# Patient Record
Sex: Female | Born: 1935 | Race: White | Hispanic: No | Smoking: Former smoker
Health system: Southern US, Community
[De-identification: ages and names within clinical notes are randomized; demographics above are authoritative.]

## PROBLEM LIST (undated history)

## (undated) DIAGNOSIS — M797 Fibromyalgia: Secondary | ICD-10-CM

## (undated) DIAGNOSIS — F32A Depression, unspecified: Secondary | ICD-10-CM

## (undated) DIAGNOSIS — E785 Hyperlipidemia, unspecified: Secondary | ICD-10-CM

## (undated) DIAGNOSIS — G459 Transient cerebral ischemic attack, unspecified: Secondary | ICD-10-CM

## (undated) DIAGNOSIS — G8929 Other chronic pain: Secondary | ICD-10-CM

## (undated) DIAGNOSIS — G43909 Migraine, unspecified, not intractable, without status migrainosus: Secondary | ICD-10-CM

## (undated) DIAGNOSIS — Z8719 Personal history of other diseases of the digestive system: Secondary | ICD-10-CM

## (undated) DIAGNOSIS — F419 Anxiety disorder, unspecified: Secondary | ICD-10-CM

## (undated) DIAGNOSIS — J189 Pneumonia, unspecified organism: Secondary | ICD-10-CM

## (undated) DIAGNOSIS — M1990 Unspecified osteoarthritis, unspecified site: Secondary | ICD-10-CM

## (undated) DIAGNOSIS — M549 Dorsalgia, unspecified: Secondary | ICD-10-CM

## (undated) DIAGNOSIS — I10 Essential (primary) hypertension: Secondary | ICD-10-CM

## (undated) DIAGNOSIS — F329 Major depressive disorder, single episode, unspecified: Secondary | ICD-10-CM

## (undated) DIAGNOSIS — K219 Gastro-esophageal reflux disease without esophagitis: Secondary | ICD-10-CM

## (undated) HISTORY — PX: BACK SURGERY: SHX140

## (undated) HISTORY — PX: CARPAL TUNNEL RELEASE: SHX101

## (undated) HISTORY — PX: APPENDECTOMY: SHX54

---

## 2005-05-31 HISTORY — PX: LUMBAR DISC SURGERY: SHX700

## 2013-08-29 HISTORY — PX: CAROTID ENDARTERECTOMY: SUR193

## 2014-07-13 ENCOUNTER — Inpatient Hospital Stay (HOSPITAL_COMMUNITY): Payer: Medicare (Managed Care)

## 2014-07-13 ENCOUNTER — Inpatient Hospital Stay (HOSPITAL_COMMUNITY)
Admission: AD | Admit: 2014-07-13 | Discharge: 2014-07-16 | DRG: 482 | Disposition: A | Discharge status: Home Health | Payer: Medicare (Managed Care) | Source: Other Acute Inpatient Hospital | Attending: Internal Medicine | Admitting: Internal Medicine

## 2014-07-13 ENCOUNTER — Encounter (HOSPITAL_COMMUNITY): Payer: Self-pay | Admitting: Internal Medicine

## 2014-07-13 DIAGNOSIS — F418 Other specified anxiety disorders: Secondary | ICD-10-CM | POA: Diagnosis present

## 2014-07-13 DIAGNOSIS — M810 Age-related osteoporosis without current pathological fracture: Secondary | ICD-10-CM | POA: Diagnosis present

## 2014-07-13 DIAGNOSIS — R059 Cough, unspecified: Secondary | ICD-10-CM

## 2014-07-13 DIAGNOSIS — E785 Hyperlipidemia, unspecified: Secondary | ICD-10-CM | POA: Diagnosis present

## 2014-07-13 DIAGNOSIS — F419 Anxiety disorder, unspecified: Secondary | ICD-10-CM | POA: Diagnosis present

## 2014-07-13 DIAGNOSIS — S72002A Fracture of unspecified part of neck of left femur, initial encounter for closed fracture: Principal | ICD-10-CM | POA: Diagnosis present

## 2014-07-13 DIAGNOSIS — M797 Fibromyalgia: Secondary | ICD-10-CM | POA: Diagnosis present

## 2014-07-13 DIAGNOSIS — W19XXXA Unspecified fall, initial encounter: Secondary | ICD-10-CM | POA: Diagnosis present

## 2014-07-13 DIAGNOSIS — R4589 Other symptoms and signs involving emotional state: Secondary | ICD-10-CM | POA: Diagnosis present

## 2014-07-13 DIAGNOSIS — R05 Cough: Secondary | ICD-10-CM

## 2014-07-13 DIAGNOSIS — S7292XA Unspecified fracture of left femur, initial encounter for closed fracture: Secondary | ICD-10-CM | POA: Diagnosis present

## 2014-07-13 DIAGNOSIS — T148XXA Other injury of unspecified body region, initial encounter: Secondary | ICD-10-CM

## 2014-07-13 DIAGNOSIS — Z419 Encounter for procedure for purposes other than remedying health state, unspecified: Secondary | ICD-10-CM

## 2014-07-13 DIAGNOSIS — S7291XA Unspecified fracture of right femur, initial encounter for closed fracture: Secondary | ICD-10-CM

## 2014-07-13 DIAGNOSIS — I10 Essential (primary) hypertension: Secondary | ICD-10-CM | POA: Diagnosis present

## 2014-07-13 HISTORY — DX: Anxiety disorder, unspecified: F41.9

## 2014-07-13 HISTORY — DX: Pneumonia, unspecified organism: J18.9

## 2014-07-13 HISTORY — DX: Essential (primary) hypertension: I10

## 2014-07-13 HISTORY — DX: Unspecified osteoarthritis, unspecified site: M19.90

## 2014-07-13 HISTORY — DX: Hyperlipidemia, unspecified: E78.5

## 2014-07-13 HISTORY — DX: Fibromyalgia: M79.7

## 2014-07-13 HISTORY — DX: Gastro-esophageal reflux disease without esophagitis: K21.9

## 2014-07-13 HISTORY — DX: Dorsalgia, unspecified: M54.9

## 2014-07-13 HISTORY — DX: Depression, unspecified: F32.A

## 2014-07-13 HISTORY — DX: Migraine, unspecified, not intractable, without status migrainosus: G43.909

## 2014-07-13 HISTORY — DX: Other chronic pain: G89.29

## 2014-07-13 HISTORY — DX: Major depressive disorder, single episode, unspecified: F32.9

## 2014-07-13 HISTORY — DX: Transient cerebral ischemic attack, unspecified: G45.9

## 2014-07-13 HISTORY — DX: Personal history of other diseases of the digestive system: Z87.19

## 2014-07-13 LAB — MRSA PCR SCREENING: MRSA by PCR: NEGATIVE

## 2014-07-13 LAB — BASIC METABOLIC PANEL
Anion gap: 8 (ref 5–15)
BUN: 15 mg/dL (ref 6–23)
CHLORIDE: 105 mmol/L (ref 96–112)
CO2: 26 mmol/L (ref 19–32)
Calcium: 9.1 mg/dL (ref 8.4–10.5)
Creatinine, Ser: 1.07 mg/dL (ref 0.50–1.10)
GFR calc Af Amer: 56 mL/min — ABNORMAL LOW (ref >90)
GFR calc non Af Amer: 48 mL/min — ABNORMAL LOW (ref >90)
Glucose, Bld: 93 mg/dL (ref 70–99)
Potassium: 4.1 mmol/L (ref 3.5–5.1)
SODIUM: 139 mmol/L (ref 135–145)

## 2014-07-13 LAB — CBC
HEMATOCRIT: 42 % (ref 36.0–46.0)
HEMOGLOBIN: 13.7 g/dL (ref 12.0–15.0)
MCH: 30.4 pg (ref 26.0–34.0)
MCHC: 32.6 g/dL (ref 30.0–36.0)
MCV: 93.3 fL (ref 78.0–100.0)
PLATELETS: 202 10*3/uL (ref 150–400)
RBC: 4.5 MIL/uL (ref 3.87–5.11)
RDW: 12.9 % (ref 11.5–15.5)
WBC: 8.3 10*3/uL (ref 4.0–10.5)

## 2014-07-13 LAB — PROTIME-INR
INR: 0.97 (ref 0.00–1.49)
Prothrombin Time: 13 seconds (ref 11.6–15.2)

## 2014-07-13 MED ORDER — ONDANSETRON HCL 4 MG PO TABS: 4.0000 mg | ORAL_TABLET | Freq: Four times a day (QID) | ORAL | Status: DC | PRN

## 2014-07-13 MED ORDER — LORAZEPAM 1 MG PO TABS
1.0000 mg | ORAL_TABLET | Freq: Three times a day (TID) | ORAL | Status: DC | PRN
  Administered 2014-07-13 – 2014-07-16 (×3): 1 mg via ORAL
  Filled 2014-07-13 (×4): qty 1

## 2014-07-13 MED ORDER — MORPHINE SULFATE 2 MG/ML IJ SOLN: 0.5000 mg | INTRAMUSCULAR | Status: DC | PRN

## 2014-07-13 MED ORDER — LOSARTAN POTASSIUM 50 MG PO TABS
50.0000 mg | ORAL_TABLET | Freq: Every day | ORAL | Status: DC
  Administered 2014-07-15 – 2014-07-16 (×2): 50 mg via ORAL
  Filled 2014-07-13 (×4): qty 1

## 2014-07-13 MED ORDER — VITAMIN D3 25 MCG (1000 UNIT) PO TABS
1000.0000 [IU] | ORAL_TABLET | Freq: Two times a day (BID) | ORAL | Status: DC
  Administered 2014-07-13 – 2014-07-16 (×5): 1000 [IU] via ORAL
  Filled 2014-07-13 (×8): qty 1

## 2014-07-13 MED ORDER — ONDANSETRON HCL 4 MG/2ML IJ SOLN: 4.0000 mg | Freq: Four times a day (QID) | INTRAMUSCULAR | Status: DC | PRN

## 2014-07-13 MED ORDER — DULOXETINE HCL 30 MG PO CPEP
30.0000 mg | ORAL_CAPSULE | Freq: Every day | ORAL | Status: DC
  Administered 2014-07-13 – 2014-07-16 (×3): 30 mg via ORAL
  Filled 2014-07-13 (×4): qty 1

## 2014-07-13 MED ORDER — CEFAZOLIN SODIUM-DEXTROSE 2-3 GM-% IV SOLR
2.0000 g | INTRAVENOUS | Status: AC
  Administered 2014-07-14: 2 g via INTRAVENOUS
  Filled 2014-07-13: qty 50

## 2014-07-13 MED ORDER — SODIUM CHLORIDE 0.9 % IV SOLN
INTRAVENOUS | Status: AC
  Administered 2014-07-13: 19:00:00 via INTRAVENOUS

## 2014-07-13 MED ORDER — DEXTROSE-NACL 5-0.45 % IV SOLN: 100.0000 mL/h | INTRAVENOUS | Status: DC

## 2014-07-13 MED ORDER — ATORVASTATIN CALCIUM 40 MG PO TABS
40.0000 mg | ORAL_TABLET | Freq: Every day | ORAL | Status: DC
  Administered 2014-07-13 – 2014-07-16 (×4): 40 mg via ORAL
  Filled 2014-07-13 (×5): qty 1

## 2014-07-13 MED ORDER — ESCITALOPRAM OXALATE 20 MG PO TABS
20.0000 mg | ORAL_TABLET | Freq: Every day | ORAL | Status: DC
  Administered 2014-07-15: 20 mg via ORAL
  Filled 2014-07-13 (×3): qty 1
  Filled 2014-07-13: qty 2
  Filled 2014-07-13: qty 1

## 2014-07-13 MED ORDER — HYDROCODONE-ACETAMINOPHEN 5-325 MG PO TABS
1.0000 | ORAL_TABLET | Freq: Four times a day (QID) | ORAL | Status: DC | PRN
  Administered 2014-07-13 – 2014-07-14 (×2): 2 via ORAL
  Administered 2014-07-15: 1 via ORAL
  Administered 2014-07-15 – 2014-07-16 (×2): 2 via ORAL
  Filled 2014-07-13 (×5): qty 2
  Filled 2014-07-13: qty 1

## 2014-07-13 MED ORDER — ACETAMINOPHEN 500 MG PO TABS: 1000.0000 mg | ORAL_TABLET | Freq: Once | ORAL | Status: DC

## 2014-07-13 MED ORDER — POLYETHYLENE GLYCOL 3350 17 G PO PACK
17.0000 g | PACK | Freq: Two times a day (BID) | ORAL | Status: DC
  Administered 2014-07-15 – 2014-07-16 (×2): 17 g via ORAL
  Filled 2014-07-13 (×5): qty 1

## 2014-07-13 MED ORDER — GUAIFENESIN-DM 100-10 MG/5ML PO SYRP
5.0000 mL | ORAL_SOLUTION | ORAL | Status: DC | PRN
  Filled 2014-07-13: qty 5

## 2014-07-13 MED ORDER — METOPROLOL TARTRATE 1 MG/ML IV SOLN
5.0000 mg | INTRAVENOUS | Status: DC | PRN
  Filled 2014-07-13: qty 5

## 2014-07-13 MED ORDER — ALUM & MAG HYDROXIDE-SIMETH 200-200-20 MG/5ML PO SUSP: 30.0000 mL | Freq: Four times a day (QID) | ORAL | Status: DC | PRN

## 2014-07-13 MED ORDER — GABAPENTIN 300 MG PO CAPS
300.0000 mg | ORAL_CAPSULE | Freq: Three times a day (TID) | ORAL | Status: DC | PRN
  Administered 2014-07-14: 300 mg via ORAL
  Filled 2014-07-13: qty 1

## 2014-07-13 NOTE — H&P (Signed)
Patient Demographics  Kimberly Higgins, is a 81 y.o. female  MRN: [id number]   DOB - 1933-06-27  Admit Date - 07/13/2014  Outpatient Primary MD for the patient is No primary care provider on file.   With History of -  Past Medical History  Diagnosis Date  . Essential hypertension   . Dyslipidemia   . Anxiety   . Depressed affect   . Fibromyalgia       Past Surgical History  Procedure Laterality Date  . Spine surgery    . Carotid endarterectomy    . Carpel tun    . Appendectomy      in for   Transfer from [facility] for left femoral neck fracture  HPI  Kimberly Higgins  is a 82 y.o. female, with history of essential hypertension, dyslipidemia, left carotid artery disease status post left carotid endarterectomy in April of last year cleared with chemical stress test in [place] prior to surgery, fibromyalgia, anxiety and depression, who sustained a mechanical fall after which she experienced left-sided hip pain. Presented to [facility] where she was diagnosed with femoral neck fracture on the left side. She was then transferred here under the care of hospitalist after Dr. Eulah Pont agree to operate on her.  Patient currently is symptom free except for mild left hip discomfort which is dull constant nonradiating pain worse with bearing weight better with rest and pain medication nauseated symptoms. Pain has been ongoing since the fall this afternoon. She denies any headache, no chest pain cough phlegm for palpitations, no shortness of breath at rest, no abdominal pain, no dysuria, no blood in stool or urine. No focal weakness.    Review of Systems    In addition to the HPI above,   No Fever-chills, No Headache, No changes with Vision or hearing, No problems swallowing food or Liquids, No Chest  pain, Cough or Shortness of Breath, No Abdominal pain, No Nausea or Vommitting, Bowel movements are regular, No Blood in stool or Urine, No dysuria, No new skin rashes or bruises, No new joints pains-aches,  except left hip pain as above  No new weakness, tingling, numbness in any extremity, No recent weight gain or loss, No polyuria, polydypsia or polyphagia, No significant Mental Stressors.  A full 10 point Review of Systems was done, except as stated above, all other Review of Systems were negative.   Social History History  Substance Use Topics  . Smoking status: Not on file  . Smokeless tobacco: Not on file  . Alcohol Use: Not on file     Family History No family history of CAD at a young age  Prior to Admission medications   Medication Sig Start Date End Date Taking? Authorizing Provider  atorvastatin (LIPITOR) 40 MG tablet Take 40 mg by mouth daily.   Yes Historical Provider, MD  cholecalciferol (VITAMIN D) 1000 UNITS tablet Take 1,000 Units by mouth 2 (two) times daily.  Yes Historical Provider, MD  DULoxetine (CYMBALTA) 30 MG capsule Take 30 mg by mouth daily.   Yes Historical Provider, MD  escitalopram (LEXAPRO) 20 MG tablet Take 20 mg by mouth daily.   Yes Historical Provider, MD  gabapentin (NEURONTIN) 300 MG capsule Take 300 mg by mouth 3 (three) times daily.   Yes Historical Provider, MD  LORazepam (ATIVAN) 1 MG tablet Take 1 mg by mouth every 8 (eight) hours as needed for anxiety.   Yes Historical Provider, MD  losartan (COZAAR) 50 MG tablet Take 50 mg by mouth daily.   Yes Historical Provider, MD    Allergies  Allergen Reactions  . Niacin And Related Itching    Red face  . Theophyllines Palpitations    Physical Exam  Vitals  Blood pressure 147/88, pulse 87, temperature 97.9 F (36.6 C), temperature source Oral, resp. rate 18, SpO2 98 %.   1. General Pleasant elderly white femaleing in bed in NAD,     2. Normal affect and insight, Not Suicidal or  Homicidal, Awake Alert, Oriented X 3.  3. No F.N deficits, ALL C.Nerves Intact, Strength 5/5 all 4 extremities, Sensation intact all 4 extremities, Plantars down going.  4. Ears and Eyes appear Normal, Conjunctivae clear, PERRLA. Moist Oral Mucosa.  5. Supple Neck, No JVD, No cervical lymphadenopathy appriciated, No Carotid Bruits.  6. Symmetrical Chest wall movement, Good air movement bilaterally, CTAB.  7. RRR, No Gallops, Rubs or Murmurs, No Parasternal Heave.  8. Positive Bowel Sounds, Abdomen Soft, No tenderness, No organomegaly appriciated,No rebound -guarding or rigidity.  9.  No Cyanosis, Normal Skin Turgor, No Skin Rash or Bruise.  10. Good muscle tone,  joints appear normal , no effusions, Normal ROM.  11. No Palpable Lymph Nodes in Neck or Axillae     Data Review  CBC No results for input(s): WBC, HGB, HCT, PLT, MCV, MCH, MCHC, RDW, LYMPHSABS, MONOABS, EOSABS, BASOSABS, BANDABS in the last 168 hours.  Invalid input(s): NEUTRABS, BANDSABD ------------------------------------------------------------------------------------------------------------------  Chemistries  No results for input(s): NA, K, CL, CO2, GLUCOSE, BUN, CREATININE, CALCIUM, MG, AST, ALT, ALKPHOS, BILITOT in the last 168 hours.  Invalid input(s): GFRCGP ------------------------------------------------------------------------------------------------------------------ CrCl cannot be calculated (Unknown ideal weight.). ------------------------------------------------------------------------------------------------------------------ No results for input(s): TSH, T4TOTAL, T3FREE, THYROIDAB in the last 72 hours.  Invalid input(s): FREET3   Coagulation profile No results for input(s): INR, PROTIME in the last 168 hours. ------------------------------------------------------------------------------------------------------------------- No results for input(s): DDIMER in the last 72  hours. -------------------------------------------------------------------------------------------------------------------  Cardiac Enzymes No results for input(s): CKMB, TROPONINI, MYOGLOBIN in the last 168 hours.  Invalid input(s): CK ------------------------------------------------------------------------------------------------------------------ Invalid input(s): POCBNP   ---------------------------------------------------------------------------------------------------------------  Urinalysis No results found for: COLORURINE, APPEARANCEUR, LABSPEC, PHURINE, GLUCOSEU, HGBUR, BILIRUBINUR, KETONESUR, PROTEINUR, UROBILINOGEN, NITRITE, LEUKOCYTESUR  ----------------------------------------------------------------------------------------------------------------  Imaging results:   No results found.   No labs, chest x-ray, EKG were obtained at [facility]. I have ordered them. We will review in the morning prior to surgery.     EKG obtained stat. Normal sinus rhythm with a rate of 64 bpm, nonspecific ST changes nonacute.   Assessment & Plan   1. Mechanical fall with closed left femoral neck fracture. Will be admitted to MedSurg bed, nothing by mouth after midnight, discussed with Dr. Marcial Pacas he will operate on the patient tomorrow morning. No labs were drawn at [facility]. We will draw baseline labs along with chest x-ray.    Cardio-Pulm Risk stratification for surgery and recommendations to minimize the same:-  A.Cardio-Pulmonary Risk -  this  patient is a moderate risk  for adverse Cardio-Pulmonary  Outcome  from surgery, the risks and benefits were discussed and acceptable to the patient.  Recommendations for optimizing Cardio-Pulmonary  Risk risk factors  1. Keep SBP<140, HR<85, use Lopressor 5mg  IV q4hrs PRN, or B.Blocker drip PRN. 2. Moniotr I&Os. 3. Minimal sedation and Narcotics. 4. Good pulmunary toilet. 5. PRN Nebs and as needed oxygen to  keep Pox>90% 6. Hb>8, transfuse as needed- Lasix 10mg  IV after each unit PRBC Transfused.   B.Bleeding Risk - no previous surgical complications, no easy bruising,  Antiplate meds none. Will obtain baseline CBC with platelet count along with baseline EKG, BMP and INR.   Will request Surgeon to please Order DVT prophylaxis of his/her choice, along with activity, weight bearing precautions and diet if appropriate.      2. Essential hypertension. Home Cozaar will be continued along with as needed IV Lopressor.   3. Depression and anxiety. Continue Cymbalta and Lexapro on with as needed Ativan.   4. Fibromyalgia. On Neurontin and stable. Continue.   5.Dyslipidemia. Continue statin unchanged.     DVT Prophylaxis  SCDs for now prior to surgery tomorrow morning.   AM Labs Ordered, also please review Full Orders  Family Communication: Admission, patients condition and plan of care including tests being ordered have been discussed with the patient  who indicate understanding and agree with the plan and Code Status.  Code Status Full  Likely DC to  TBD  Condition Fair  Time spent in minutes : 35    Traveion Ruddock K M.D on 07/13/2014 at 6:45 PM  Between 7am to 7pm - Pager - [phone]  After 7pm go to [email or web address] - password [email or web address]  [facility] Office  [phone]

## 2014-07-13 NOTE — Consult Note (Signed)
[facility] have [facility] and discussed with the patient over the phone. Tentative plan is for OR tomorrow morning pending clearence and I will order an xray for tonight too   Margarita RanaMURPHY, Stanislawa Gaffin, D Cell: [phone]

## 2014-07-13 NOTE — Consult Note (Signed)
ORTHOPAEDIC CONSULTATION  REQUESTING PHYSICIAN: Cristal Ford, DO  Chief Complaint: left fem neck fracture  HPI: Kimberly Higgins is a 82 y.o. female who has a history including a left carotid endarterectomy in April last yr. She suffered a mechanical fall and was transferred from [facility] last night. C/o pain in the left groin. She reports that ever since a tick bite four years ago she has had several slow healing sores.   Past Medical History  Diagnosis Date  . Essential hypertension   . Dyslipidemia   . Anxiety   . Depressed affect   . Fibromyalgia    Past Surgical History  Procedure Laterality Date  . Spine surgery    . Carotid endarterectomy    . Carpel tun    . Appendectomy     History   Social History  . Marital Status: Divorced    Spouse Name: N/A  . Number of Children: N/A  . Years of Education: N/A   Social History Main Topics  . Smoking status: Not on file  . Smokeless tobacco: Not on file  . Alcohol Use: Not on file  . Drug Use: Not on file  . Sexual Activity: Not on file   Other Topics Concern  . Not on file   Social History Narrative  . No narrative on file   No family history on file. Allergies  Allergen Reactions  . Theophyllines    Prior to Admission medications   Medication Sig Start Date End Date Taking? Authorizing Provider  atorvastatin (LIPITOR) 40 MG tablet Take 40 mg by mouth daily.   Yes Historical Provider, MD  cholecalciferol (VITAMIN D) 1000 UNITS tablet Take 1,000 Units by mouth 2 (two) times daily.   Yes Historical Provider, MD  DULoxetine (CYMBALTA) 30 MG capsule Take 30 mg by mouth daily.   Yes Historical Provider, MD  escitalopram (LEXAPRO) 20 MG tablet Take 20 mg by mouth daily.   Yes Historical Provider, MD  gabapentin (NEURONTIN) 300 MG capsule Take 300 mg by mouth 3 (three) times daily.   Yes Historical Provider, MD  LORazepam (ATIVAN) 1 MG tablet Take 1 mg by mouth every 8 (eight) hours as needed for anxiety.   Yes  Historical Provider, MD  losartan (COZAAR) 50 MG tablet Take 50 mg by mouth daily.   Yes Historical Provider, MD   No results found.  Positive ROS: All other systems have been reviewed and were otherwise negative with the exception of those mentioned in the HPI and as above.  Labs cbc No results for input(s): WBC, HGB, HCT, PLT in the last 72 hours.  Labs inflam No results for input(s): CRP in the last 72 hours.  Invalid input(s): ESR  Labs coag No results for input(s): INR, PTT in the last 72 hours.  Invalid input(s): PT  No results for input(s): NA, K, CL, CO2, GLUCOSE, BUN, CREATININE, CALCIUM in the last 72 hours.  Physical Exam: There were no vitals filed for this visit. General: Alert, no acute distress Cardiovascular: No pedal edema Respiratory: No cyanosis, no use of accessory musculature GI: No organomegaly, abdomen is soft and non-tender Skin: No lesions in the area of chief complaint other than those listed below in MSK exam.  Neurologic: Sensation intact distally Psychiatric: Patient is competent for consent with normal mood and affect Lymphatic: No axillary or cervical lymphadenopathy  MUSCULOSKELETAL:  LLE: pain with log roll, Distally NVI. She has a small 0.5cm sore over her GT. No drainage, no surrounding  erythema Other extremities are atraumatic with painless ROM and NVI.  Assessment: Left fem neck fracture  Plan: Hip pinning today I discussed with her and her daughter the risks and benefits. We also discussed that her sore puts her at increased risk for infection. I can avoid this with my incision, and I still feel that the benefits outweigh the risks of delaying her surgery as this would take a long time to heal  Bedrest for now, WBAT post op   Edmonia Lynch, D, MD Cell [phone]   07/13/2014 6:28 PM

## 2014-07-14 ENCOUNTER — Inpatient Hospital Stay (HOSPITAL_COMMUNITY): Payer: Medicare (Managed Care)

## 2014-07-14 ENCOUNTER — Inpatient Hospital Stay (HOSPITAL_COMMUNITY): Payer: Medicare (Managed Care) | Admitting: Anesthesiology

## 2014-07-14 ENCOUNTER — Encounter (HOSPITAL_COMMUNITY)
Admission: AD | Disposition: A | Discharge status: Home Health | Payer: Self-pay | Source: Other Acute Inpatient Hospital | Attending: Internal Medicine

## 2014-07-14 DIAGNOSIS — S7292XA Unspecified fracture of left femur, initial encounter for closed fracture: Secondary | ICD-10-CM

## 2014-07-14 DIAGNOSIS — F329 Major depressive disorder, single episode, unspecified: Secondary | ICD-10-CM

## 2014-07-14 DIAGNOSIS — M797 Fibromyalgia: Secondary | ICD-10-CM

## 2014-07-14 DIAGNOSIS — S72002A Fracture of unspecified part of neck of left femur, initial encounter for closed fracture: Principal | ICD-10-CM

## 2014-07-14 HISTORY — PX: HIP PINNING,CANNULATED: SHX1758

## 2014-07-14 LAB — CBC
HEMATOCRIT: 40.5 % (ref 36.0–46.0)
Hemoglobin: 13.1 g/dL (ref 12.0–15.0)
MCH: 30.4 pg (ref 26.0–34.0)
MCHC: 32.3 g/dL (ref 30.0–36.0)
MCV: 94 fL (ref 78.0–100.0)
Platelets: 202 10*3/uL (ref 150–400)
RBC: 4.31 MIL/uL (ref 3.87–5.11)
RDW: 13 % (ref 11.5–15.5)
WBC: 5 10*3/uL (ref 4.0–10.5)

## 2014-07-14 SURGERY — FIXATION, FEMUR, NECK, PERCUTANEOUS, USING SCREW
Anesthesia: General | Site: Hip | Laterality: Left

## 2014-07-14 MED ORDER — ASPIRIN EC 325 MG PO TBEC
325.0000 mg | DELAYED_RELEASE_TABLET | Freq: Every day | ORAL | Status: DC
  Administered 2014-07-15 – 2014-07-16 (×2): 325 mg via ORAL
  Filled 2014-07-14 (×3): qty 1

## 2014-07-14 MED ORDER — FENTANYL CITRATE 0.05 MG/ML IJ SOLN
INTRAMUSCULAR | Status: DC | PRN
  Administered 2014-07-14: 100 ug via INTRAVENOUS

## 2014-07-14 MED ORDER — MENTHOL 3 MG MT LOZG: 1.0000 | LOZENGE | OROMUCOSAL | Status: DC | PRN

## 2014-07-14 MED ORDER — NEOSTIGMINE METHYLSULFATE 10 MG/10ML IV SOLN
INTRAVENOUS | Status: DC | PRN
  Administered 2014-07-14: 2 mg via INTRAVENOUS

## 2014-07-14 MED ORDER — FENTANYL CITRATE 0.05 MG/ML IJ SOLN
25.0000 ug | INTRAMUSCULAR | Status: DC | PRN
  Administered 2014-07-14 (×2): 25 ug via INTRAVENOUS

## 2014-07-14 MED ORDER — ACETAMINOPHEN 650 MG RE SUPP: 650.0000 mg | Freq: Four times a day (QID) | RECTAL | Status: DC | PRN

## 2014-07-14 MED ORDER — LIDOCAINE HCL (CARDIAC) 20 MG/ML IV SOLN
INTRAVENOUS | Status: DC | PRN
  Administered 2014-07-14: 60 mg via INTRAVENOUS

## 2014-07-14 MED ORDER — PHENOL 1.4 % MT LIQD
1.0000 | OROMUCOSAL | Status: DC | PRN
  Filled 2014-07-14: qty 177

## 2014-07-14 MED ORDER — METOCLOPRAMIDE HCL 5 MG/ML IJ SOLN
5.0000 mg | Freq: Three times a day (TID) | INTRAMUSCULAR | Status: DC | PRN
Start: 2014-07-14 — End: 2014-07-16

## 2014-07-14 MED ORDER — PHENYLEPHRINE HCL 10 MG/ML IJ SOLN
INTRAMUSCULAR | Status: DC | PRN
  Administered 2014-07-14: 100 ug via INTRAVENOUS

## 2014-07-14 MED ORDER — ROCURONIUM BROMIDE 100 MG/10ML IV SOLN
INTRAVENOUS | Status: DC | PRN
  Administered 2014-07-14: 35 mg via INTRAVENOUS

## 2014-07-14 MED ORDER — DEXTROSE 5 % IV SOLN
10.0000 mg | INTRAVENOUS | Status: DC | PRN
  Administered 2014-07-14: 80 ug/min via INTRAVENOUS

## 2014-07-14 MED ORDER — FENTANYL CITRATE 0.05 MG/ML IJ SOLN
INTRAMUSCULAR | Status: AC
  Administered 2014-07-14: 25 ug via INTRAVENOUS
  Filled 2014-07-14: qty 2

## 2014-07-14 MED ORDER — METOCLOPRAMIDE HCL 10 MG PO TABS: 5.0000 mg | ORAL_TABLET | Freq: Three times a day (TID) | ORAL | Status: DC | PRN

## 2014-07-14 MED ORDER — EPHEDRINE SULFATE 50 MG/ML IJ SOLN
INTRAMUSCULAR | Status: DC | PRN
  Administered 2014-07-14: 15 mg via INTRAVENOUS
  Administered 2014-07-14: 10 mg via INTRAVENOUS

## 2014-07-14 MED ORDER — LACTATED RINGERS IV SOLN
INTRAVENOUS | Status: DC | PRN
  Administered 2014-07-14: 07:00:00 via INTRAVENOUS

## 2014-07-14 MED ORDER — 0.9 % SODIUM CHLORIDE (POUR BTL) OPTIME
TOPICAL | Status: DC | PRN
  Administered 2014-07-14: 1000 mL

## 2014-07-14 MED ORDER — ASPIRIN EC 325 MG PO TBEC: 325.0000 mg | DELAYED_RELEASE_TABLET | Freq: Every day | ORAL | Status: AC

## 2014-07-14 MED ORDER — ONDANSETRON HCL 4 MG/2ML IJ SOLN
INTRAMUSCULAR | Status: DC | PRN
  Administered 2014-07-14: 4 mg via INTRAVENOUS

## 2014-07-14 MED ORDER — ACETAMINOPHEN 325 MG PO TABS: 650.0000 mg | ORAL_TABLET | Freq: Four times a day (QID) | ORAL | Status: DC | PRN

## 2014-07-14 MED ORDER — BUPIVACAINE HCL (PF) 0.25 % IJ SOLN
INTRAMUSCULAR | Status: AC
  Filled 2014-07-14: qty 30

## 2014-07-14 MED ORDER — PROPOFOL 10 MG/ML IV BOLUS
INTRAVENOUS | Status: DC | PRN
  Administered 2014-07-14: 100 mg via INTRAVENOUS

## 2014-07-14 MED ORDER — GLYCOPYRROLATE 0.2 MG/ML IJ SOLN
INTRAMUSCULAR | Status: DC | PRN
  Administered 2014-07-14: 0.4 mg via INTRAVENOUS

## 2014-07-14 MED ORDER — DOCUSATE SODIUM 100 MG PO CAPS: 100.0000 mg | ORAL_CAPSULE | Freq: Two times a day (BID) | ORAL | Status: AC

## 2014-07-14 MED ORDER — CEFAZOLIN SODIUM-DEXTROSE 2-3 GM-% IV SOLR
2.0000 g | Freq: Four times a day (QID) | INTRAVENOUS | Status: AC
  Administered 2014-07-14 (×2): 2 g via INTRAVENOUS
  Filled 2014-07-14 (×2): qty 50

## 2014-07-14 MED ORDER — HYDROCODONE-ACETAMINOPHEN 5-325 MG PO TABS: 1.0000 | ORAL_TABLET | ORAL | Status: AC | PRN

## 2014-07-14 MED ORDER — ONDANSETRON HCL 4 MG/2ML IJ SOLN: 4.0000 mg | Freq: Once | INTRAMUSCULAR | Status: DC | PRN

## 2014-07-14 SURGICAL SUPPLY — 38 items
BIT DRILL 4.9 CANNULATED (BIT) ×1
BIT DRILL CANN QC 4.9 LRG (BIT) ×1 IMPLANT
COVER PERINEAL POST (MISCELLANEOUS) ×2 IMPLANT
COVER SURGICAL LIGHT HANDLE (MISCELLANEOUS) ×2 IMPLANT
DRAPE IMP U-DRAPE 54X76 (DRAPES) ×2 IMPLANT
DRAPE STERI IOBAN 125X83 (DRAPES) ×2 IMPLANT
DRILL BIT CANNULATED 4.9 (BIT) ×1
DRSG EMULSION OIL 3X3 NADH (GAUZE/BANDAGES/DRESSINGS) ×2 IMPLANT
DRSG MEPILEX BORDER 4X4 (GAUZE/BANDAGES/DRESSINGS) ×2 IMPLANT
DRSG TEGADERM 4X4.75 (GAUZE/BANDAGES/DRESSINGS) ×2 IMPLANT
DURAPREP 26ML APPLICATOR (WOUND CARE) ×2 IMPLANT
ELECT REM PT RETURN 9FT ADLT (ELECTROSURGICAL) ×2
ELECTRODE REM PT RTRN 9FT ADLT (ELECTROSURGICAL) ×1 IMPLANT
GAUZE SPONGE 4X4 12PLY STRL (GAUZE/BANDAGES/DRESSINGS) ×2 IMPLANT
GLOVE BIO SURGEON STRL SZ7.5 (GLOVE) ×4 IMPLANT
GLOVE BIOGEL PI IND STRL 8 (GLOVE) ×1 IMPLANT
GLOVE BIOGEL PI INDICATOR 8 (GLOVE) ×1
GOWN STRL REUS W/ TWL LRG LVL3 (GOWN DISPOSABLE) ×1 IMPLANT
GOWN STRL REUS W/TWL LRG LVL3 (GOWN DISPOSABLE) ×1
GUIDEWIRE ASNIS 3.2 NONCAL (WIRE) ×6 IMPLANT
KIT BASIN OR (CUSTOM PROCEDURE TRAY) ×2 IMPLANT
KIT ROOM TURNOVER OR (KITS) ×2 IMPLANT
LINER BOOT UNIVERSAL DISP (MISCELLANEOUS) ×2 IMPLANT
MANIFOLD NEPTUNE II (INSTRUMENTS) ×2 IMPLANT
NS IRRIG 1000ML POUR BTL (IV SOLUTION) ×2 IMPLANT
PACK GENERAL/GYN (CUSTOM PROCEDURE TRAY) ×2 IMPLANT
PAD ARMBOARD 7.5X6 YLW CONV (MISCELLANEOUS) ×4 IMPLANT
SCREW ASNIS 75MM (Screw) ×2 IMPLANT
SCREW ASNIS 85MM (Screw) ×4 IMPLANT
SCREW CANN 6.5X80 STRL (Screw) ×2 IMPLANT
STAPLER VISISTAT 35W (STAPLE) ×2 IMPLANT
SUT MON AB 2-0 CT1 36 (SUTURE) ×2 IMPLANT
SUT VIC AB 0 CT1 27 (SUTURE) ×1
SUT VIC AB 0 CT1 27XBRD ANBCTR (SUTURE) ×1 IMPLANT
TOWEL OR 17X24 6PK STRL BLUE (TOWEL DISPOSABLE) ×2 IMPLANT
TOWEL OR 17X26 10 PK STRL BLUE (TOWEL DISPOSABLE) ×2 IMPLANT
TOWEL OR NON WOVEN STRL DISP B (DISPOSABLE) ×2 IMPLANT
WATER STERILE IRR 1000ML POUR (IV SOLUTION) ×2 IMPLANT

## 2014-07-14 NOTE — Anesthesia Preprocedure Evaluation (Addendum)
Anesthesia Evaluation  Patient identified by MRN, date of birth, ID band Patient awake    Reviewed: Allergy & Precautions, NPO status , Patient's Chart, lab work & pertinent test results  Airway Mallampati: II  TM Distance: >3 FB Neck ROM: full    Dental  (+) Dental Advisory Given, Edentulous Upper, Poor Dentition   Pulmonary neg pulmonary ROS,          Cardiovascular hypertension, Pt. on medications + Peripheral Vascular Disease     Neuro/Psych Anxiety Fibromyalgia   Neuromuscular disease    GI/Hepatic   Endo/Other    Renal/GU      Musculoskeletal  (+) Fibromyalgia -  Abdominal   Peds  Hematology   Anesthesia Other Findings   Reproductive/Obstetrics                            Anesthesia Physical Anesthesia Plan  ASA: II  Anesthesia Plan: General   Post-op Pain Management:    Induction: Intravenous  Airway Management Planned: Oral ETT  Additional Equipment:   Intra-op Plan:   Post-operative Plan: Extubation in OR  Informed Consent: I have reviewed the patients History and Physical, chart, labs and discussed the procedure including the risks, benefits and alternatives for the proposed anesthesia with the patient or authorized representative who has indicated his/her understanding and acceptance.     Plan Discussed with: CRNA, Anesthesiologist and Surgeon  Anesthesia Plan Comments:         Anesthesia Quick Evaluation

## 2014-07-14 NOTE — Progress Notes (Signed)
[facility]                                                                              Patient Demographics  Kimberly Higgins, is a 81 y.o. female, DOB - [birth year], ZOX:[id number]  Admit date - 07/13/2014   Admitting Physician Edsel PetrinMaryann Arshiya Jakes, DO  Outpatient Primary MD for the patient is No primary care provider on file.  LOS - 1   No chief complaint on file.     HPI on 07/13/2014 Kimberly Higgins is a 81 y.o. female, with history of essential hypertension, dyslipidemia, left carotid artery disease status post left carotid endarterectomy in April of last year cleared [place] chemical stress test [place] prior to surgery, fibromyalgia, anxiety and depression, who sustained a mechanical fall after which she experienced left-sided hip pain. [facility] she was diagnosed with femoral neck fracture on the left side. She was then transferred here under the care of hospitalist after Dr. Eulah PontMurphy agree to operate on her.  Patient currently is symptom free except for mild left hip discomfort which is dull constant nonradiating pain worse with bearing weight better with rest and pain medication nauseated symptoms. Pain has been ongoing since the fall this afternoon. She denies any headache, no chest pain cough phlegm for palpitations, no shortness of breath at rest, no abdominal pain, no dysuria, no blood in stool or urine. No focal weakness.   Assessment & Plan   Left Femoral neck fracture s/p mechanical fall -[facility] -Orthopedics consulted and appreciated -s/p (POD 0) Left cannulated hip pinning -Continue pain control -Will consult PT and OT to start tomorrow -Continue Aspirin 325mg  daily and SCDs per surgery  Essential hypertension -Continue Cozaar and Lopressor when necessary  Depression/anxiety -Continue Cymbalta and Lexapro, Ativan when necessary  Fibromyalgia -Stable, continue Neurontin  Dyslipidemia -Continue  statin  Code Status: Full  Family Communication: None at bedside  Disposition Plan: Admitted, s/p surgery, PT and OT to evaluate 2/15  Time Spent in minutes   30 minutes  Procedures  Cannulated hip pinning  Consults   Orthopedic Surgery, Dr. Eulah PontMurphy  DVT Prophylaxis  SCDs  Lab Results  Component Value Date   PLT 202 07/14/2014    Medications  Scheduled Meds: . [START ON 07/15/2014] aspirin EC  325 mg Oral Q breakfast  . atorvastatin  40 mg Oral Daily  .  ceFAZolin (ANCEF) IV  2 g Intravenous Q6H  . cholecalciferol  1,000 Units Oral BID  . DULoxetine  30 mg Oral Daily  . escitalopram  20 mg Oral Daily  . losartan  50 mg Oral Daily  . polyethylene glycol  17 g Oral BID   Continuous Infusions: . sodium chloride 50 mL/hr at 07/13/14 1901   PRN Meds:.acetaminophen **OR** acetaminophen, alum & mag hydroxide-simeth, gabapentin, guaiFENesin-dextromethorphan, HYDROcodone-acetaminophen, LORazepam, menthol-cetylpyridinium **OR** phenol, metoCLOPramide **OR** metoCLOPramide (REGLAN) injection, metoprolol, morphine injection, ondansetron **OR** ondansetron (ZOFRAN) IV  Antibiotics    Anti-infectives    Start     Dose/Rate Route Frequency Ordered Stop   07/14/14 1400  ceFAZolin (ANCEF) IVPB 2 g/50 mL premix     2 g 100 mL/hr over 30 Minutes Intravenous Every  6 hours 07/14/14 1037 07/15/14 0159   07/14/14 0600  ceFAZolin (ANCEF) IVPB 2 g/50 mL premix     2 g 100 mL/hr over 30 Minutes Intravenous On call to O.R. 07/13/14 1833 07/14/14 0738        Subjective:   Kimberly Masters seen and examined today.  Patient currently feels tired and wishes to sleep. She denies any chest pain, shortness of breath, abdominal pain.  Objective:   Filed Vitals:   07/14/14 0937 07/14/14 0952 07/14/14 1015 07/14/14 1020  BP: 125/52 126/53  115/59  Pulse: 76 71  66  Temp:   97.6 F (36.4 C) 97.6 F (36.4 C)  TempSrc:    Oral  Resp: SpO2: 100% 100%  94%    Wt Readings from Last  3 Encounters:  No data found for Wt     Intake/Output Summary (Last 24 hours) at 07/14/14 1125 Last data filed at 07/14/14 0847  Gross per 24 hour  Intake   1260 ml  Output     50 ml  Net   1210 ml    Exam  General: Well developed, well nourished, NAD, appears stated age  HEENT: NCAT, mucous membranes moist.   Cardiovascular: S1 S2 auscultated, no rubs, murmurs or gallops. Regular rate and rhythm.  Respiratory: Clear to auscultation bilaterally with equal chest rise  Abdomen: Soft, nontender, nondistended, + bowel sounds  Extremities: warm dry without cyanosis clubbing or edema  Neuro: AAOx3, nonfocal, LLE strength/ROM not tested secondary to recent surgery  Psych: Normal affect and demeanor with intact judgement and insight  Data Review   Micro Results Recent Results (from the past 240 hour(s))  MRSA PCR Screening     Status: None   Collection Time: 07/13/14  9:58 PM  Result Value Ref Range Status   MRSA by PCR NEGATIVE NEGATIVE Final    Comment:        The GeneXpert MRSA Assay (FDA approved for NASAL specimens only), is one component of a comprehensive MRSA colonization surveillance program. It is not intended to diagnose MRSA infection nor to guide or monitor treatment for MRSA infections.     Radiology Reports Dg Chest 1 View  07/13/2014   CLINICAL DATA:  Tripped on a curb today.  EXAM: CHEST  1 VIEW  COMPARISON:  None.  FINDINGS: A single AP supine radiograph is negative for pneumothorax or hemothorax. Mediastinal contours are normal. The lungs are clear.  IMPRESSION: No acute findings.   Electronically Signed   By: Ellery Plunk M.D.   On: 07/13/2014 21:16   Pelvis Portable  07/14/2014   CLINICAL DATA:  Postop from internal fixation of left hip fracture.  EXAM: PORTABLE PELVIS 1-2 VIEWS  COMPARISON:  None.  FINDINGS: Three surgical screws are seen transfixing a left femoral neck fracture in near anatomic alignment. No other acute findings identified.   IMPRESSION: Status post internal fixation of left hip fracture in near anatomic alignment.   Electronically Signed   By: Myles Rosenthal M.D.   On: 07/14/2014 10:26   Dg Hip Operative Unilat With Pelvis Left  07/14/2014   CLINICAL DATA:  Three cannulated screws inserted into the left hip. Fluoroscopy time 24 seconds  EXAM: OPERATIVE LEFT HIP (WITH PELVIS IF PERFORMED) 2 VIEWS  TECHNIQUE: Fluoroscopic spot image(s) were submitted for interpretation post-operatively.  COMPARISON:  07/13/2014  FINDINGS: Three cannulated screws traverse the femoral neck following ORIF of the femoral neck. No evidence for dislocation or interval fracture.  IMPRESSION: ORIF left hip.   Electronically Signed   By: Norva Pavlov M.D.   On: 07/14/2014 08:52   Dg Hip Unilat With Pelvis 2-3 Views Left  07/13/2014   CLINICAL DATA:  Tripped on curb, with acute onset of left hip pain. Initial encounter.  EXAM: LEFT HIP (WITH PELVIS) 2-3 VIEWS  COMPARISON:  None.  FINDINGS: There is a minimally displaced proximal transcervical fracture through the left femoral neck. No additional fractures are seen. The left femoral head remains seated at the acetabulum. Mild degenerative change is noted at the pubic symphysis and at the lower lumbar spine. The sacroiliac joints are grossly unremarkable. The right hip is unremarkable in appearance. No significant hip joint space narrowing is seen.  The visualized bowel gas pattern is grossly unremarkable. Scattered phleboliths are seen in the pelvis.  IMPRESSION: Minimally displaced proximal transcervical fracture through the left femoral neck.   Electronically Signed   By: Roanna Raider M.D.   On: 07/13/2014 21:17    CBC  Recent Labs Lab 07/13/14 1857 07/14/14 0528  WBC 8.3 5.0  HGB 13.7 13.1  HCT 42.0 40.5  PLT 202 202  MCV 93.3 94.0  MCH 30.4 30.4  MCHC 32.6 32.3  RDW 12.9 13.0    Chemistries   Recent Labs Lab 07/13/14 1857  NA 139  K 4.1  CL 105  CO2 26  GLUCOSE 93  BUN 15    CREATININE 1.07  CALCIUM 9.1   ------------------------------------------------------------------------------------------------------------------ CrCl cannot be calculated (Unknown ideal weight.). ------------------------------------------------------------------------------------------------------------------ No results for input(s): HGBA1C in the last 72 hours. ------------------------------------------------------------------------------------------------------------------ No results for input(s): CHOL, HDL, LDLCALC, TRIG, CHOLHDL, LDLDIRECT in the last 72 hours. ------------------------------------------------------------------------------------------------------------------ No results for input(s): TSH, T4TOTAL, T3FREE, THYROIDAB in the last 72 hours.  Invalid input(s): FREET3 ------------------------------------------------------------------------------------------------------------------ No results for input(s): VITAMINB12, FOLATE, FERRITIN, TIBC, IRON, RETICCTPCT in the last 72 hours.  Coagulation profile  Recent Labs Lab 07/13/14 1857  INR 0.97    No results for input(s): DDIMER in the last 72 hours.  Cardiac Enzymes No results for input(s): CKMB, TROPONINI, MYOGLOBIN in the last 168 hours.  Invalid input(s): CK ------------------------------------------------------------------------------------------------------------------ Invalid input(s): POCBNP    Makisha Marrin D.O. on 07/14/2014 at 11:25 AM  Between 7am to 7pm - Pager - [phone]  After 7pm go to [email or web address] - password [email or web address]  And look for the night coverage person covering for me after hours  [facility] Office  [phone]

## 2014-07-14 NOTE — Transfer of Care (Signed)
Immediate Anesthesia Transfer of Care Note  Patient: Kimberly Higgins  Procedure(s) Performed: Procedure(s): CANNULATED HIP PINNING (Left)  Patient Location: PACU  Anesthesia Type:General  Level of Consciousness: awake  Airway & Oxygen Therapy: Patient Spontanous Breathing and Patient connected to nasal cannula oxygen  Post-op Assessment: Report given to RN and Post -op Vital signs reviewed and stable  Post vital signs: Reviewed and stable  Last Vitals:  Filed Vitals:   07/14/14 0416  BP: 135/63  Pulse: 75  Temp: 36.8 C  Resp: 18    Complications: No apparent anesthesia complications

## 2014-07-14 NOTE — Anesthesia Postprocedure Evaluation (Signed)
Anesthesia Post Note  Patient: Kimberly Higgins  Procedure(s) Performed: Procedure(s) (LRB): CANNULATED HIP PINNING (Left)  Anesthesia type: General  Patient location: PACU  Post pain: Pain level controlled and Adequate analgesia  Post assessment: Post-op Vital signs reviewed, Patient's Cardiovascular Status Stable, Respiratory Function Stable, Patent Airway and Pain level controlled  Last Vitals:  Filed Vitals:   07/14/14 0937  BP: 125/52  Pulse: 76  Temp:   Resp: 13    Post vital signs: Reviewed and stable  Level of consciousness: awake, alert  and oriented  Complications: No apparent anesthesia complications

## 2014-07-14 NOTE — Op Note (Signed)
07/13/2014 - 07/14/2014  8:32 AM  PATIENT:  Kimberly MastersNancy Ditmars    PRE-OPERATIVE DIAGNOSIS:  left hip fracture  POST-OPERATIVE DIAGNOSIS:  Same  PROCEDURE:  CANNULATED HIP PINNING  SURGEON:  Leyna Vanderkolk, D, MD  ASSISTANT: none    ANESTHESIA:   General  PREOPERATIVE INDICATIONS:  Kimberly Mastersancy Confer is a  81 y.o. female who fell and was found to have a diagnosis of left hip fracture who elected for surgical management.    The risks benefits and alternatives were discussed with the patient preoperatively including but not limited to the risks of infection, bleeding, nerve injury, cardiopulmonary complications, blood clots, malunion, nonunion, avascular necrosis, the need for revision surgery, the potential for conversion to hemiarthroplasty, among others, and the patient was willing to proceed.  OPERATIVE IMPLANTS: 6.5 mm cannulated screws x3  OPERATIVE FINDINGS: Clinical osteoporosis with weak bone, proximal femur  OPERATIVE PROCEDURE: The patient was brought to the operating room and placed in supine position. IV antibiotics were given. General anesthesia administered. Foley was also given. The patient was placed on the fracture table. The operative extremity was positioned, without any significant reduction maneuver and was prepped and draped in usual sterile fashion.  Time out was performed.  Small incisions were made distal to the greater trochanter, and 3 guidewires were introduced Into an inverted triangle configuration. The lengths were measured. The reduction was slightly valgus, and near-anatomic. I opened the cortex with a cannulated drill, and then placed the screws into position. Satisfactory fixation was achieved. I sequentially tightened the screws by hand.  I performed a live fluoroscopic exam and no screw penetrance was noted. All threads crossed the fracture site.   The wounds were irrigated copiously, and repaired with Vicryl with Steri-Strips and sterile gauze. There no  complications and the patient tolerated the procedure well.  The patient will be weightbearing as tolerated, VTE prophylaxis will be: ASA 325 daily for 30 days   This note was generated using a template and dragon dictation system. In light of that, I have reviewed the note and all aspects of it are applicable to this case. Any dictation errors are due to the computerized dictation system.

## 2014-07-14 NOTE — Anesthesia Procedure Notes (Signed)
Procedure Name: Intubation Date/Time: 07/14/2014 7:37 AM Performed by: Alanda AmassFRIEDMAN, Demaurion Dicioccio A Pre-anesthesia Checklist: Patient identified, Emergency Drugs available, Suction available, Patient being monitored and Timeout performed Patient Re-evaluated:Patient Re-evaluated prior to inductionOxygen Delivery Method: Circle system utilized Preoxygenation: Pre-oxygenation with 100% oxygen Intubation Type: IV induction Ventilation: Mask ventilation without difficulty Laryngoscope Size: Mac and 3 Grade View: Grade I Tube type: Oral Tube size: 7.5 mm Number of attempts: 1 Airway Equipment and Method: Stylet Secured at: 21 cm Tube secured with: Tape Dental Injury: Teeth and Oropharynx as per pre-operative assessment

## 2014-07-14 NOTE — Progress Notes (Signed)
Orthopedic Tech Progress Note Patient Details:  Kimberly Higgins 1933-06-09 [id number] Patient refused application of OHF stating she did not think she would be able to use it due to fibromyalgia and problems with neck pain. RN notified.  Patient ID: Kimberly Higgins, female   DOB: 1933-06-09, 81 y.o.   MRN: [id number]   Kimberly Higgins 07/14/2014, 12:47 PM

## 2014-07-15 ENCOUNTER — Encounter (HOSPITAL_COMMUNITY): Payer: Self-pay | Admitting: General Practice

## 2014-07-15 LAB — BASIC METABOLIC PANEL
ANION GAP: 3 — AB (ref 5–15)
BUN: 11 mg/dL (ref 6–23)
CO2: 31 mmol/L (ref 19–32)
CREATININE: 1.08 mg/dL (ref 0.50–1.10)
Calcium: 8.9 mg/dL (ref 8.4–10.5)
Chloride: 104 mmol/L (ref 96–112)
GFR calc non Af Amer: 48 mL/min — ABNORMAL LOW (ref >90)
GFR, EST AFRICAN AMERICAN: 55 mL/min — AB (ref >90)
Glucose, Bld: 149 mg/dL — ABNORMAL HIGH (ref 70–99)
POTASSIUM: 4.5 mmol/L (ref 3.5–5.1)
Sodium: 138 mmol/L (ref 135–145)

## 2014-07-15 LAB — CBC
HEMATOCRIT: 37.5 % (ref 36.0–46.0)
HEMOGLOBIN: 12.1 g/dL (ref 12.0–15.0)
MCH: 30 pg (ref 26.0–34.0)
MCHC: 32.3 g/dL (ref 30.0–36.0)
MCV: 92.8 fL (ref 78.0–100.0)
Platelets: 192 10*3/uL (ref 150–400)
RBC: 4.04 MIL/uL (ref 3.87–5.11)
RDW: 13 % (ref 11.5–15.5)
WBC: 6.4 10*3/uL (ref 4.0–10.5)

## 2014-07-15 NOTE — Evaluation (Signed)
Occupational Therapy Evaluation Patient Details Name: Kimberly Higgins MRN: [id number] DOB: May 20, 1934 Today's Date: 07/15/2014    History of Present Illness mechanical fall after which she experienced left-sided hip pain. Pt s/p Left cannulated hip pinning   Clinical Impression   Patient independent PTA. Patient currently requires min>mod assist with functional mobility/transfers and ADLs. Patient will benefit from acute OT to increase overall independence in the areas of ADLs, functional mobility, and overall safety in order to safely discharge home. Discussed importance of 24/7 supervision/assistance at home, patient agreed.     Follow Up Recommendations  Home health OT;Supervision/Assistance - 24 hour    Equipment Recommendations  3 in 1 bedside comode    Recommendations for Other Services  None at this time.      Precautions / Restrictions Precautions Precautions: Fall Restrictions Weight Bearing Restrictions: Yes LLE Weight Bearing: Weight bearing as tolerated      Mobility Bed Mobility Overal bed mobility: Needs Assistance Bed Mobility: Rolling;Sidelying to Sit Rolling: Supervision Sidelying to sit: Supervision       General bed mobility comments: Cues for safety  Transfers Overall transfer level: Needs assistance Equipment used: Standard walker Transfers: Sit to/from Stand Sit to Stand: Min guard         General transfer comment: Cues for safety and hand placement     Balance Overall balance assessment: Needs assistance Sitting-balance support: Feet supported;No upper extremity supported Sitting balance-Leahy Scale: Good     Standing balance support: Bilateral upper extremity supported;During functional activity Standing balance-Leahy Scale: Fair      ADL Overall ADL's : Needs assistance/impaired Eating/Feeding: Independent   Grooming: Supervision/safety;Standing   Upper Body Bathing: Set up;Sitting   Lower Body Bathing: Moderate  assistance;Sit to/from stand   Upper Body Dressing : Set up;Sitting   Lower Body Dressing: Moderate assistance;Sit to/from stand   Toilet Transfer: RW;Minimal assistance;Comfort height toilet;Ambulation;Cueing for safety   Toileting- Clothing Manipulation and Hygiene: Min guard;Cueing for safety;Sit to/from stand       Functional mobility during ADLs: Minimal assistance;Rolling walker General ADL Comments: Patient required cues for safety during all functional mobility and transfers. Patient able to bring BLEs up to self for LB ADLs, but requires increased time. 02 sats remained greater than 90% on room air during all activities. Encouraged pursed lip breathing prn and rest breaks. Discussed importance of 24/7 supervision/assistance at home.     Pertinent Vitals/Pain Pain Assessment: No/denies pain     Hand Dominance Right   Extremity/Trunk Assessment Upper Extremity Assessment Upper Extremity Assessment: Overall WFL for tasks assessed   Lower Extremity Assessment Lower Extremity Assessment: Defer to PT evaluation   Cervical / Trunk Assessment Cervical / Trunk Assessment: Normal   Communication Communication Communication: No difficulties   Cognition Arousal/Alertness: Awake/alert Behavior During Therapy: WFL for tasks assessed/performed Overall Cognitive Status: Within Functional Limits for tasks assessed              Home Living Family/patient expects to be discharged to:: Private residence Living Arrangements: Children (daughter and granddaughter) Available Help at Discharge: Family;Available PRN/intermittently Type of Home: Mobile home Home Access: Stairs to enter Entrance Stairs-Number of Steps: 3 in front   Home Layout: One level     Bathroom Shower/Tub: Tub/shower unit;Curtain   Bathroom Toilet: Standard     Home Equipment: None   Prior Functioning/Environment Level of Independence: Independent      OT Diagnosis: Generalized weakness;Acute pain    OT Problem List: Decreased strength;Decreased range of motion;Impaired balance (sitting and/or standing);Decreased  activity tolerance;Decreased coordination;Decreased safety awareness;Decreased knowledge of use of DME or AE;Decreased knowledge of precautions;Pain   OT Treatment/Interventions: Self-care/ADL training;Therapeutic exercise;Energy conservation;DME and/or AE instruction;Therapeutic activities;Patient/family education;Balance training    OT Goals(Current goals can be found in the care plan section) Acute Rehab OT Goals Patient Stated Goal: go home OT Goal Formulation: With patient Time For Goal Achievement: 07/22/14 Potential to Achieve Goals: Good ADL Goals Pt Will Perform Lower Body Bathing: with supervision;sit to/from stand;with adaptive equipment Pt Will Perform Lower Body Dressing: with supervision;with adaptive equipment;sit to/from stand Pt Will Transfer to Toilet: with supervision;ambulating;bedside commode Pt Will Perform Tub/Shower Transfer: with supervision;Tub transfer;3 in 1;rolling walker;ambulating  OT Frequency: Min 2X/week   Barriers to D/C: None known at this time, pt reports that she will have someone at home 24/7       Co-evaluation PT/OT/SLP Co-Evaluation/Treatment: Yes Reason for Co-Treatment: For patient/therapist safety   OT goals addressed during session: ADL's and self-care      End of Session Equipment Utilized During Treatment: Gait belt;Rolling walker  Activity Tolerance: Patient tolerated treatment well Patient left: in chair;with call bell/phone within reach   Time: 1113-1151 OT Time Calculation (min): 38 min Charges:  OT General Charges $OT Visit: 1 Procedure OT Evaluation $Initial OT Evaluation Tier I: 1 Procedure OT Treatments $Self Care/Home Management : 8-22 mins  Chastity Noland , MS, OTR/L, CLT Pager: [phone]  07/15/2014, 12:01 PM

## 2014-07-15 NOTE — Progress Notes (Signed)
     Subjective:  Patient reports pain as mild.  She was able to get up and ambulate with PT.    Objective:   VITALS:   Filed Vitals:   07/15/14 0400 07/15/14 0602 07/15/14 0800 07/15/14 1500  BP:  132/54  98/54  Pulse:  74  77  Temp:  99.7 F (37.6 C)  98.6 F (37 C)  TempSrc:  Oral  Oral  Resp: 16 18 18 16   Weight:  61.5 kg (135 lb 9.3 oz)    SpO2: 96% 99% 95% 97%    Neurologically intact ABD soft Neurovascular intact Sensation intact distally Intact pulses distally Incision: dressing C/D/I   Lab Results  Component Value Date   WBC 6.4 07/15/2014   HGB 12.1 07/15/2014   HCT 37.5 07/15/2014   MCV 92.8 07/15/2014   PLT 192 07/15/2014   BMET    Component Value Date/Time   NA 138 07/15/2014 0800   K 4.5 07/15/2014 0800   CL 104 07/15/2014 0800   CO2 31 07/15/2014 0800   GLUCOSE 149* 07/15/2014 0800   BUN 11 07/15/2014 0800   CREATININE 1.08 07/15/2014 0800   CALCIUM 8.9 07/15/2014 0800   GFRNONAA 48* 07/15/2014 0800   GFRAA 55* 07/15/2014 0800     Assessment/Plan: 1 Day Post-Op   Principal Problem:   Femur fracture, left Active Problems:   Essential hypertension   Dyslipidemia   Fibromyalgia   Depressed affect   Anxiety   Closed left hip fracture   Up with therapy Discharge home with home health WBAT ASA for DVT prophylaxis   Lumina Gitto Marie 07/15/2014, 4:19 PM

## 2014-07-15 NOTE — Progress Notes (Signed)
[facility]                                                                              Patient Demographics  Kimberly Higgins, is a 81 y.o. female, DOB - Sep 26, 1933, ZOX:[id number]  Admit date - 07/13/2014   Admitting Physician Edsel PetrinMaryann Safi Culotta, DO  Outpatient Primary MD for the patient is No primary care provider on file.  LOS - 2   No chief complaint on file.     HPI on 07/13/2014 Kimberly Higgins is a 81 y.o. female, with history of essential hypertension, dyslipidemia, left carotid artery disease status post left carotid endarterectomy in April of last year cleared [place] chemical [place] to surgery, fibromyalgia, anxiety and depression, who sustained a mechanical fall after which she experienced left-sided hip pain. [facility] where she was diagnosed with femoral neck fracture on the left side. She was then transferred here under the care of hospitalist after Dr. Eulah PontMurphy agree to operate on her.  Patient currently is symptom free except for mild left hip discomfort which is dull constant nonradiating pain worse with bearing weight better with rest and pain medication nauseated symptoms. Pain has been ongoing since the fall this afternoon. She denies any headache, no chest pain cough phlegm for palpitations, no shortness of breath at rest, no abdominal pain, no dysuria, no blood in stool or urine. No focal weakness.   Assessment & Plan   Left Femoral neck fracture s/p mechanical fall -[facility] -Orthopedics consulted and appreciated -s/p Left cannulated hip pinning -Continue pain control -Pending PT/OT evaluations -Continue Aspirin 325mg  daily and SCDs per surgery  Essential hypertension -Continue Cozaar and Lopressor when necessary  Depression/anxiety -Continue Cymbalta and Lexapro, Ativan when necessary  Fibromyalgia -Stable, continue Neurontin  Dyslipidemia -Continue statin  Code Status:  Full  Family Communication: None at bedside  Disposition Plan: Admitted, s/p surgery, PT and OT to evaluate, likely will need SNF  Time Spent in minutes   30 minutes  Procedures  Cannulated hip pinning  Consults   Orthopedic Surgery, Dr. Eulah PontMurphy  DVT Prophylaxis  SCDs  Lab Results  Component Value Date   PLT 192 07/15/2014    Medications  Scheduled Meds: . aspirin EC  325 mg Oral Q breakfast  . atorvastatin  40 mg Oral Daily  . cholecalciferol  1,000 Units Oral BID  . DULoxetine  30 mg Oral Daily  . escitalopram  20 mg Oral Daily  . losartan  50 mg Oral Daily  . polyethylene glycol  17 g Oral BID   Continuous Infusions:   PRN Meds:.acetaminophen **OR** acetaminophen, alum & mag hydroxide-simeth, gabapentin, guaiFENesin-dextromethorphan, HYDROcodone-acetaminophen, LORazepam, menthol-cetylpyridinium **OR** phenol, metoCLOPramide **OR** metoCLOPramide (REGLAN) injection, metoprolol, morphine injection, ondansetron **OR** ondansetron (ZOFRAN) IV  Antibiotics    Anti-infectives    Start     Dose/Rate Route Frequency Ordered Stop   07/14/14 1400  ceFAZolin (ANCEF) IVPB 2 g/50 mL premix     2 g 100 mL/hr over 30 Minutes Intravenous Every 6 hours 07/14/14 1037 07/14/14 2013   07/14/14 0600  ceFAZolin (ANCEF) IVPB 2 g/50 mL premix     2 g 100 mL/hr  over 30 Minutes Intravenous On call to O.R. 07/13/14 1833 07/14/14 0738        Subjective:   Kimberly Higgins seen and examined today.  Patient denies any pain.  She denies any chest pain, shortness of breath, abdominal pain.  She states she really to have physical therapy however is afraid of the pain that she may have.  Objective:   Filed Vitals:   07/15/14 0115 07/15/14 0400 07/15/14 0602 07/15/14 0800  BP: 104/52  132/54   Pulse: 78  74   Temp: 98.7 F (37.1 C)  99.7 F (37.6 C)   TempSrc: Oral  Oral   Resp: Weight:   61.5 kg (135 lb 9.3 oz)   SpO2: 99% 96% 99% 95%    Wt Readings from Last 3  Encounters:  07/15/14 61.5 kg (135 lb 9.3 oz)     Intake/Output Summary (Last 24 hours) at 07/15/14 1110 Last data filed at 07/14/14 1315  Gross per 24 hour  Intake     50 ml  Output      0 ml  Net     50 ml    Exam  General: Well developed, NAD  HEENT: NCAT, mucous membranes moist.   Cardiovascular: S1 S2 auscultated, RRR  Respiratory: Clear to auscultation bilaterally with equal chest rise  Abdomen: Soft, nontender, nondistended, + bowel sounds  Extremities: warm dry without cyanosis clubbing or edema, bandage on left hip  Data Review   Micro Results Recent Results (from the past 240 hour(s))  MRSA PCR Screening     Status: None   Collection Time: 07/13/14  9:58 PM  Result Value Ref Range Status   MRSA by PCR NEGATIVE NEGATIVE Final    Comment:        The GeneXpert MRSA Assay (FDA approved for NASAL specimens only), is one component of a comprehensive MRSA colonization surveillance program. It is not intended to diagnose MRSA infection nor to guide or monitor treatment for MRSA infections.     Radiology Reports Dg Chest 1 View  07/13/2014   CLINICAL DATA:  Tripped on a curb today.  EXAM: CHEST  1 VIEW  COMPARISON:  None.  FINDINGS: A single AP supine radiograph is negative for pneumothorax or hemothorax. Mediastinal contours are normal. The lungs are clear.  IMPRESSION: No acute findings.   Electronically Signed   By: Ellery Plunk M.D.   On: 07/13/2014 21:16   Pelvis Portable  07/14/2014   CLINICAL DATA:  Postop from internal fixation of left hip fracture.  EXAM: PORTABLE PELVIS 1-2 VIEWS  COMPARISON:  None.  FINDINGS: Three surgical screws are seen transfixing a left femoral neck fracture in near anatomic alignment. No other acute findings identified.  IMPRESSION: Status post internal fixation of left hip fracture in near anatomic alignment.   Electronically Signed   By: Myles Rosenthal M.D.   On: 07/14/2014 10:26   Dg Hip Operative Unilat With Pelvis  Left  07/14/2014   CLINICAL DATA:  Three cannulated screws inserted into the left hip. Fluoroscopy time 24 seconds  EXAM: OPERATIVE LEFT HIP (WITH PELVIS IF PERFORMED) 2 VIEWS  TECHNIQUE: Fluoroscopic spot image(s) were submitted for interpretation post-operatively.  COMPARISON:  07/13/2014  FINDINGS: Three cannulated screws traverse the femoral neck following ORIF of the femoral neck. No evidence for dislocation or interval fracture.  IMPRESSION: ORIF left hip.   Electronically Signed   By: Norva Pavlov M.D.   On: 07/14/2014 08:52   Dg  Hip Unilat With Pelvis 2-3 Views Left  07/13/2014   CLINICAL DATA:  Tripped on curb, with acute onset of left hip pain. Initial encounter.  EXAM: LEFT HIP (WITH PELVIS) 2-3 VIEWS  COMPARISON:  None.  FINDINGS: There is a minimally displaced proximal transcervical fracture through the left femoral neck. No additional fractures are seen. The left femoral head remains seated at the acetabulum. Mild degenerative change is noted at the pubic symphysis and at the lower lumbar spine. The sacroiliac joints are grossly unremarkable. The right hip is unremarkable in appearance. No significant hip joint space narrowing is seen.  The visualized bowel gas pattern is grossly unremarkable. Scattered phleboliths are seen in the pelvis.  IMPRESSION: Minimally displaced proximal transcervical fracture through the left femoral neck.   Electronically Signed   By: Roanna Raider M.D.   On: 07/13/2014 21:17    CBC  Recent Labs Lab 07/13/14 1857 07/14/14 0528 07/15/14 0800  WBC 8.3 5.0 6.4  HGB 13.7 13.1 12.1  HCT 42.0 40.5 37.5  PLT 202 202 192  MCV 93.3 94.0 92.8  MCH 30.4 30.4 30.0  MCHC 32.6 32.3 32.3  RDW 12.9 13.0 13.0    Chemistries   Recent Labs Lab 07/13/14 1857 07/15/14 0800  NA 139 138  K 4.1 4.5  CL 105 104  CO2 26 31  GLUCOSE 93 149*  BUN 15 11  CREATININE 1.07 1.08  CALCIUM 9.1 8.9    ------------------------------------------------------------------------------------------------------------------ CrCl cannot be calculated (Unknown ideal weight.). ------------------------------------------------------------------------------------------------------------------ No results for input(s): HGBA1C in the last 72 hours. ------------------------------------------------------------------------------------------------------------------ No results for input(s): CHOL, HDL, LDLCALC, TRIG, CHOLHDL, LDLDIRECT in the last 72 hours. ------------------------------------------------------------------------------------------------------------------ No results for input(s): TSH, T4TOTAL, T3FREE, THYROIDAB in the last 72 hours.  Invalid input(s): FREET3 ------------------------------------------------------------------------------------------------------------------ No results for input(s): VITAMINB12, FOLATE, FERRITIN, TIBC, IRON, RETICCTPCT in the last 72 hours.  Coagulation profile  Recent Labs Lab 07/13/14 1857  INR 0.97    No results for input(s): DDIMER in the last 72 hours.  Cardiac Enzymes No results for input(s): CKMB, TROPONINI, MYOGLOBIN in the last 168 hours.  Invalid input(s): CK ------------------------------------------------------------------------------------------------------------------ Invalid input(s): POCBNP    Ali Mclaurin D.O. on 07/15/2014 at 11:10 AM  Between 7am to 7pm - Pager - [phone]  After 7pm go to [email or web address] - password [email or web address]  And look for the night coverage person covering for me after hours  [facility] Office  [phone]

## 2014-07-15 NOTE — Evaluation (Signed)
Physical Therapy Evaluation Patient Details Name: Kimberly Higgins Cormany MRN: [id number] DOB: Apr 10, 1934 Today's Date: 07/15/2014   History of Present Illness  mechanical fall after which she experienced left-sided hip pain. Pt s/p Left cannulated hip pinning  Past Medical History  Diagnosis Date  . Essential hypertension   . Dyslipidemia   . Anxiety   . Depressed affect   . Fibromyalgia    Past Surgical History  Procedure Laterality Date  . Spine surgery    . Carotid endarterectomy    . Carpel tun    . Appendectomy       Clinical Impression  Patient is s/p above surgery resulting in functional limitations due to the deficits listed below (see PT Problem List).  Patient will benefit from skilled PT to increase their independence and safety with mobility to allow discharge to the venue listed below.       Follow Up Recommendations Home health PT;Supervision/Assistance - 24 hour    Equipment Recommendations  Rolling walker with 5" wheels;3in1 (PT)    Recommendations for Other Services       Precautions / Restrictions Precautions Precautions: Fall Restrictions Weight Bearing Restrictions: Yes LLE Weight Bearing: Weight bearing as tolerated      Mobility  Bed Mobility Overal bed mobility: Needs Assistance Bed Mobility: Rolling;Sidelying to Sit Rolling: Supervision Sidelying to sit: Supervision       General bed mobility comments: Cues for safety  Transfers Overall transfer level: Needs assistance Equipment used: Rolling walker (2 wheeled) Transfers: Sit to/from Stand Sit to Stand: Min guard         General transfer comment: Cues for safety and hand placement   Ambulation/Gait Ambulation/Gait assistance: Min assist Ambulation Distance (Feet): 12 Feet (walked to/from bathroom) Assistive device: Rolling walker (2 wheeled) Gait Pattern/deviations: Step-to pattern     General Gait Details: Cues for gait sequence; min assist at times mostly for RW  management  Stairs            Wheelchair Mobility    Modified Rankin (Stroke Patients Only)       Balance Overall balance assessment: Needs assistance Sitting-balance support: Feet supported;No upper extremity supported Sitting balance-Leahy Scale: Good     Standing balance support: Bilateral upper extremity supported;During functional activity Standing balance-Leahy Scale: Fair                               Pertinent Vitals/Pain Pain Assessment: 0-10 Pain Score: 5  Pain Location: Brief catch-like pain at times L hip; resolves rather quickly Pain Descriptors / Indicators: Sharp Pain Intervention(s): Monitored during session    Home Living Family/patient expects to be discharged to:: Private residence Living Arrangements: Children (daughter and granddaughter) Available Help at Discharge: Family;Available PRN/intermittently Type of Home: Mobile home Home Access: Stairs to enter Entrance Stairs-Rails: Right;Left;Can reach both Entrance Stairs-Number of Steps: 3 in front Home Layout: One level Home Equipment: None      Prior Function Level of Independence: Independent               Hand Dominance   Dominant Hand: Right    Extremity/Trunk Assessment   Upper Extremity Assessment: Overall WFL for tasks assessed           Lower Extremity Assessment: LLE deficits/detail   LLE Deficits / Details: Grossly decr AROM and strngth, limited by "catch-like" pain L hip (which subsides quickly  Cervical / Trunk Assessment: Normal  Communication   Communication: No difficulties  Cognition Arousal/Alertness: Awake/alert Behavior During Therapy: WFL for tasks assessed/performed Overall Cognitive Status: Within Functional Limits for tasks assessed                      General Comments General comments (skin integrity, edema, etc.): Emphasized the need to take things slowly     Exercises        Assessment/Plan    PT Assessment  Patient needs continued PT services  PT Diagnosis Difficulty walking;Acute pain   PT Problem List Decreased strength;Decreased range of motion;Decreased activity tolerance;Decreased balance;Decreased mobility;Decreased knowledge of use of DME;Decreased safety awareness;Decreased knowledge of precautions;Pain  PT Treatment Interventions DME instruction;Gait training;Stair training;Functional mobility training;Therapeutic activities;Therapeutic exercise;Balance training;Patient/family education   PT Goals (Current goals can be found in the Care Plan section) Acute Rehab PT Goals Patient Stated Goal: go home PT Goal Formulation: With patient Time For Goal Achievement: 07/22/14 Potential to Achieve Goals: Good    Frequency Min 6X/week   Barriers to discharge Decreased caregiver support Emphasized to pt that at this time she will need someone giving close guard assist when she's on her feet    Co-evaluation PT/OT/SLP Co-Evaluation/Treatment: Yes Reason for Co-Treatment: For patient/therapist safety PT goals addressed during session: Mobility/safety with mobility OT goals addressed during session: ADL's and self-care       End of Session Equipment Utilized During Treatment: Gait belt Activity Tolerance: Patient tolerated treatment well Patient left: Other (comment) (with OT in bathroom) Nurse Communication: Mobility status         Time: [phone] (minus a few minutes searching for short RW) PT Time Calculation (min) (ACUTE ONLY): 24 min   Charges:   PT Evaluation $Initial PT Evaluation Tier I: 1 Procedure     PT G CodesVan Clines Hamff 07/15/2014, 12:43 PM  Van Clines, PT  Acute Rehabilitation Services Pager [phone] Office [phone]

## 2014-07-15 NOTE — Progress Notes (Signed)
Utilization review completed.  

## 2014-07-16 MED ORDER — GUAIFENESIN-DM 100-10 MG/5ML PO SYRP
5.0000 mL | ORAL_SOLUTION | ORAL | Status: AC | PRN
Start: 2014-07-16 — End: ?

## 2014-07-16 NOTE — Discharge Summary (Signed)
Physician Discharge Summary  Kimberly Higgins [id number] DOB: [birth year] DOA: 07/13/2014  PCP: No primary care provider on file.  Admit date: 07/13/2014 Discharge date: 07/16/2014  Time spent: 45 minutes  Recommendations for Outpatient Follow-up:  Patient will be discharged to home with home health physical and occupational therapy.  Patient to continue her medications as prescribed. Patient will need follow-up with orthopedics within 1 week of discharge. Patient to continue a heart healthy diet. Patient should also follow-up with her primary care physician within one week of discharge.  Discharge Diagnoses:  Left femoral neck fracture status post mechanical fall Essential hypertension Depression/anxiety Fibromyalgia Dyslipidemia  Discharge Condition: Stable  Diet recommendation: Heart healthy  Filed Weights   07/15/14 0602  Weight: 61.5 kg (135 lb 9.3 oz)    History of present illness:  on 07/13/2014 by Dr. Susa RaringPrashant Singh Kimberly Mastersancy Sinning is a 81 y.o. female, with history of essential hypertension, dyslipidemia, left carotid artery disease status post left carotid endarterectomy in April of last year cleared with chemical stress test in [place] prior to surgery, fibromyalgia, anxiety and depression, who sustained a mechanical fall after which she experienced left-sided hip pain. [facility] she was diagnosed with femoral neck fracture on the left side. She was then transferred here under the care of hospitalist after Dr. Eulah PontMurphy agree to operate on her. Patient currently is symptom free except for mild left hip discomfort which is dull constant nonradiating pain worse with bearing weight better with rest and pain medication nauseated symptoms. Pain has been ongoing since the fall this afternoon. She denies any headache, no chest pain cough phlegm for palpitations, no shortness of breath at rest, no abdominal pain, no dysuria, no blood in stool or urine. No  focal weakness.  Hospital Course:  Left Femoral neck fracture s/p mechanical fall -[facility] -Orthopedics consulted and appreciated -s/p Left cannulated hip pinning -Continue pain control -PT and OT recommended home health, rolling walker and 3-in1 commode. -Continue Aspirin 325mg  daily  -Patient will need to followup with Dr. Eulah PontMurphy  Essential hypertension -Continue Cozaar and Lopressor when necessary  Depression/anxiety -Continue Cymbalta and Lexapro, Ativan when necessary  Fibromyalgia -Stable, continue Neurontin  Dyslipidemia -Continue statin  Procedures: Cannulated hip pinning  Consultations: Orthopedic Surgery, Dr. Eulah PontMurphy  Discharge Exam: Filed Vitals:   07/16/14 0800  BP:   Pulse:   Temp:   Resp: 18   Exam  General: Well developed, NAD  HEENT: NCAT, mucous membranes moist.   Cardiovascular: S1 S2 auscultated, RRR  Respiratory: Clear to auscultation bilaterally with equal chest rise  Abdomen: Soft, nontender, nondistended, + bowel sounds  Extremities: warm dry without cyanosis clubbing or edema  Discharge Instructions      Discharge Instructions    Discharge instructions    Complete by:  As directed   Patient will be discharged to home with home health physical and occupational therapy.  Patient to continue her medications as prescribed. Patient will need follow-up with orthopedics within 1 week of discharge. Patient to continue a heart healthy diet. Patient should also follow-up with her primary care physician within one week of discharge.     Weight bearing as tolerated    Complete by:  As directed             Medication List    TAKE these medications        aspirin EC 325 MG tablet  Take 1 tablet (325 mg total) by mouth daily.  atorvastatin 40 MG tablet  Commonly known as:  LIPITOR  Take 40 mg by mouth daily.     cholecalciferol 1000 UNITS tablet  Commonly known as:  VITAMIN D  Take 1,000  Units by mouth 2 (two) times daily.     docusate sodium 100 MG capsule  Commonly known as:  COLACE  Take 1 capsule (100 mg total) by mouth 2 (two) times daily. Continue this while taking narcotics to help with bowel movements     DULoxetine 30 MG capsule  Commonly known as:  CYMBALTA  Take 30 mg by mouth daily.     escitalopram 20 MG tablet  Commonly known as:  LEXAPRO  Take 20 mg by mouth daily.     gabapentin 300 MG capsule  Commonly known as:  NEURONTIN  Take 300 mg by mouth 3 (three) times daily as needed (pain).     guaiFENesin-dextromethorphan 100-10 MG/5ML syrup  Commonly known as:  ROBITUSSIN DM  Take 5 mLs by mouth every 4 (four) hours as needed for cough.     HYDROcodone-acetaminophen 5-325 MG per tablet  Commonly known as:  NORCO  Take 1-2 tablets by mouth every 4 (four) hours as needed for moderate pain.     LORazepam 1 MG tablet  Commonly known as:  ATIVAN  Take 1 mg by mouth every 8 (eight) hours as needed for anxiety.     losartan 50 MG tablet  Commonly known as:  COZAAR  Take 50 mg by mouth daily.       Allergies  Allergen Reactions  . Niacin And Related Itching    Red face  . Theophyllines Palpitations   Follow-up Information    Follow up with [name], TIMOTHY, D, MD In 1 week.   Specialty:  Orthopedic Surgery   Contact information:   [address] [place] [address] [phone]       Follow up with Glenis Smoker, MD. Schedule an appointment as soon as possible for a visit in 1 week.   Specialty:  Internal Medicine   Why:  Hospital followup   Contact information:   [address] [place] [address] [phone]        The results of significant diagnostics from this hospitalization (including imaging, microbiology, ancillary and laboratory) are listed below for reference.    Significant Diagnostic Studies: Dg Chest 1 View  07/13/2014   CLINICAL DATA:  Tripped on a curb today.  EXAM: CHEST  1 VIEW  COMPARISON:   None.  FINDINGS: A single AP supine radiograph is negative for pneumothorax or hemothorax. Mediastinal contours are normal. The lungs are clear.  IMPRESSION: No acute findings.   Electronically Signed   By: Ellery Plunk M.D.   On: 07/13/2014 21:16   Pelvis Portable  07/14/2014   CLINICAL DATA:  Postop from internal fixation of left hip fracture.  EXAM: PORTABLE PELVIS 1-2 VIEWS  COMPARISON:  None.  FINDINGS: Three surgical screws are seen transfixing a left femoral neck fracture in near anatomic alignment. No other acute findings identified.  IMPRESSION: Status post internal fixation of left hip fracture in near anatomic alignment.   Electronically Signed   By: Myles Rosenthal M.D.   On: 07/14/2014 10:26   Dg Hip Operative Unilat With Pelvis Left  07/14/2014   CLINICAL DATA:  Three cannulated screws inserted into the left hip. Fluoroscopy time 24 seconds  EXAM: OPERATIVE LEFT HIP (WITH PELVIS IF PERFORMED) 2 VIEWS  TECHNIQUE: Fluoroscopic spot image(s) were  submitted for interpretation post-operatively.  COMPARISON:  07/13/2014  FINDINGS: Three cannulated screws traverse the femoral neck following ORIF of the femoral neck. No evidence for dislocation or interval fracture.  IMPRESSION: ORIF left hip.   Electronically Signed   By: Norva Pavlov M.D.   On: 07/14/2014 08:52   Dg Hip Unilat With Pelvis 2-3 Views Left  07/13/2014   CLINICAL DATA:  Tripped on curb, with acute onset of left hip pain. Initial encounter.  EXAM: LEFT HIP (WITH PELVIS) 2-3 VIEWS  COMPARISON:  None.  FINDINGS: There is a minimally displaced proximal transcervical fracture through the left femoral neck. No additional fractures are seen. The left femoral head remains seated at the acetabulum. Mild degenerative change is noted at the pubic symphysis and at the lower lumbar spine. The sacroiliac joints are grossly unremarkable. The right hip is unremarkable in appearance. No significant hip joint space narrowing is seen.  The visualized  bowel gas pattern is grossly unremarkable. Scattered phleboliths are seen in the pelvis.  IMPRESSION: Minimally displaced proximal transcervical fracture through the left femoral neck.   Electronically Signed   By: Roanna Raider M.D.   On: 07/13/2014 21:17    Microbiology: Recent Results (from the past 240 hour(s))  MRSA PCR Screening     Status: None   Collection Time: 07/13/14  9:58 PM  Result Value Ref Range Status   MRSA by PCR NEGATIVE NEGATIVE Final    Comment:        The GeneXpert MRSA Assay (FDA approved for NASAL specimens only), is one component of a comprehensive MRSA colonization surveillance program. It is not intended to diagnose MRSA infection nor to guide or monitor treatment for MRSA infections.      Labs: Basic Metabolic Panel:  Recent Labs Lab 07/13/14 1857 07/15/14 0800  NA 139 138  K 4.1 4.5  CL 105 104  CO2 26 31  GLUCOSE 93 149*  BUN 15 11  CREATININE 1.07 1.08  CALCIUM 9.1 8.9   Liver Function Tests: No results for input(s): AST, ALT, ALKPHOS, BILITOT, PROT, ALBUMIN in the last 168 hours. No results for input(s): LIPASE, AMYLASE in the last 168 hours. No results for input(s): AMMONIA in the last 168 hours. CBC:  Recent Labs Lab 07/13/14 1857 07/14/14 0528 07/15/14 0800  WBC 8.3 5.0 6.4  HGB 13.7 13.1 12.1  HCT 42.0 40.5 37.5  MCV 93.3 94.0 92.8  PLT 202 202 192   Cardiac Enzymes: No results for input(s): CKTOTAL, CKMB, CKMBINDEX, TROPONINI in the last 168 hours. BNP: BNP (last 3 results) No results for input(s): BNP in the last 8760 hours.  ProBNP (last 3 results) No results for input(s): PROBNP in the last 8760 hours.  CBG: No results for input(s): GLUCAP in the last 168 hours.     SignedEdsel Petrin  [facility] 07/16/2014, 10:14 AM

## 2014-07-16 NOTE — Progress Notes (Signed)
Occupational Therapy Treatment Patient Details Name: Kimberly Higgins MRN: [id number] DOB: 03/01/1934 Today's Date: 07/16/2014    History of present illness mechanical fall after which she experienced left-sided hip pain. Pt s/p Left cannulated hip pinning   OT comments  Patient progressing towards goals, continue plan of care. Changed d/c plan > no follow-up OT at this time. Continue to recommend 24/7 supervision/assistance.    Follow Up Recommendations  No OT follow up;Supervision/Assistance - 24 hour    Equipment Recommendations  3 in 1 bedside comode    Recommendations for Other Services  None at this time    Precautions / Restrictions Precautions Precautions: Fall Restrictions Weight Bearing Restrictions: Yes LLE Weight Bearing: Weight bearing as tolerated       Mobility - Per PT note Bed Mobility Overal bed mobility: Needs Assistance Bed Mobility: Supine to Sit     Supine to sit: Supervision     General bed mobility comments: Cues for safety and technique  Transfers Overall transfer level: Needs assistance Equipment used: Rolling walker (2 wheeled) Transfers: Sit to/from Stand Sit to Stand: Supervision   General transfer comment: Cues for safety and hand placement     Balance Overall balance assessment: Needs assistance Sitting-balance support: Bilateral upper extremity supported;Feet supported Sitting balance-Leahy Scale: Good     Standing balance support: Bilateral upper extremity supported;During functional activity Standing balance-Leahy Scale: Fair    ADL Overall ADL's : Needs assistance/impaired Eating/Feeding: Independent   Grooming: Supervision/safety;Standing   Upper Body Bathing: Set up;Sitting   Lower Body Bathing: Supervison/ safety;Sit to/from stand;Cueing for safety   Upper Body Dressing : Set up;Sitting   Lower Body Dressing: Supervision/safety;Sit to/from stand;Cueing for safety   Toilet Transfer:  Supervision/safety;RW;Ambulation;BSC   Toileting- ArchitectClothing Manipulation and Hygiene: Supervision/safety;Sit to/from stand;Cueing for safety   Tub/ Shower Transfer: Tub transfer;3 in 1;Ambulation;Rolling walker;Minimal assistance     General ADL Comments: Patient able to bring BLEs to self for LB ADLs. Patient overall supervision for functional mobility/transfers and ADLs, except patient did require min assist for tub/shower transfer using 3-in-1. Continue to recommend 24/7 supervision/assistance, no OT follow-up recommended at this time.      Cognition   Behavior During Therapy: WFL for tasks assessed/performed Overall Cognitive Status: Within Functional Limits for tasks assessed                 Pertinent Vitals/ Pain       Pain Assessment: No/denies pain Pain Score: 2  Pain Location: L hip Pain Descriptors / Indicators: Aching Pain Intervention(s): Monitored during session         Frequency Min 2X/week     Progress Toward Goals  OT Goals(current goals can now be found in the care plan section)  Progress towards OT goals: Progressing toward goals  Acute Rehab OT Goals Patient Stated Goal: go home; would like to go today  Plan Discharge plan needs to be updated       End of Session Equipment Utilized During Treatment: Rolling walker   Activity Tolerance Patient tolerated treatment well   Patient Left in chair;with call bell/phone within reach     Time: 1156-1219 OT Time Calculation (min): 23 min  Charges: OT General Charges $OT Visit: 1 Procedure OT Treatments $Self Care/Home Management : 23-37 mins  Carle Dargan , MS, OTR/L, CLT Pager: [phone]  07/16/2014, 12:28 PM

## 2014-07-16 NOTE — Discharge Instructions (Signed)
Keep your incision dry and covered  Bear weight as tolerated  Hip Fracture A hip fracture is a fracture of the upper part of your thigh bone (femur).  CAUSES A hip fracture is caused by a direct blow to the side of your hip. This is usually the result of a fall but can occur in other circumstances, such as an automobile accident. RISK FACTORS There is an increased risk of hip fractures in people with:  An unsteady walking pattern (gait) and those with conditions that contribute to poor balance, such as Parkinson's disease or dementia.  Osteopenia and osteoporosis.  Cancer that spreads to the leg bones.  Certain metabolic diseases. SYMPTOMS  Symptoms of hip fracture include:  Pain over the injured hip.  Inability to put weight on the leg in which the fracture occurred (although, some patients are able to walk after a hip fracture).  Toes and foot of the affected leg point outward when you lie down. DIAGNOSIS A physical exam can determine if a hip fracture is likely to have occurred. X-ray exams are needed to confirm the fracture and to look for other injuries. The X-ray exam can help to determine the type of hip fracture. Rarely, the fracture is not visible on an X-ray image and a CT scan or MRI will have to be done. TREATMENT  The treatment for a fracture is usually surgery. This means using a screw, nail, or rod to hold the bones in place.  HOME CARE INSTRUCTIONS Take all medicines as directed by your health care provider. SEEK MEDICAL CARE IF: Pain continues, even after taking pain medicine. MAKE SURE YOU:  Understand these instructions.   Will watch your condition.  Will get help right away if you are not doing well or get worse. Document Released: 05/17/2005 Document Revised: 05/22/2013 Document Reviewed: 12/27/2012 [facility] Patient Information 2015 [employer] [place]. This information is not intended to replace advice given to you by your health care provider. Make sure  you discuss any questions you have with your health care provider.

## 2014-07-16 NOTE — Progress Notes (Signed)
D/C instructions and scripts given to Pt. Pts daughter is at the bedside and felt comfortable with discharge plan and is taking pt home at this time.

## 2014-07-16 NOTE — Progress Notes (Signed)
CARE MANAGEMENT NOTE 07/16/2014  Patient:  Kimberly Higgins,Kimberly Higgins   Account Number:  [id number]  Date Initiated:  07/16/2014  Documentation [facility]:  [facility]  Subjective/Objective Assessment:   left hip fracture, s/p left hip pinning     Action/Plan:   PT/OT evals- recommended HHPT and HHOT   Anticipated DC Date:  07/16/2014   Anticipated DC Plan:  HOME W HOME HEALTH SERVICES      DC Planning Services  CM consult      PAC Choice  DURABLE MEDICAL EQUIPMENT  HOME HEALTH   Choice offered to / List presented to:  C-1 Patient   DME arranged  3-N-1  Levan HurstWALKER - ROLLING      DME agency  [facility]     HH arranged  HH-2 PT  HH-3 OT      HH [facility]   Status of service:  Completed, signed off Medicare Important Message given?  YES Date Medicare IM given:  07/16/2014 Medicare IM given by:  [facility]Kalel Harty  Discharge Disposition:  HOME W HOME HEALTH SERVICES  Per UR Regulation:  Reviewed for med. necessity/level of care/duration of stay    Comments:  07/16/14 Spoke with patient about HHC. [facility], set up HHPT and HHOT. Faxed order, face to face, facesheet, H and P, op note and d/c summary to [phone] and received confirmation. Contacted Frank with [facility] and requested 3N1 and rolling walker be delivered to patient's room. Patient stated that her daughter will be able to assist her after d/c.No other discharge needs identified.

## 2014-07-16 NOTE — Progress Notes (Signed)
Physical Therapy Treatment Patient Details Name: Kimberly Higgins MRN: [id number] DOB: 12-Aug-1933 Today's Date: 07/16/2014    History of Present Illness mechanical fall after which she experienced left-sided hip pain. Pt s/p Left cannulated hip pinning    PT Comments    Noting great progress with acitvity tolerance and much better steadiness with gait and RW management with a RW that is better fitted to pt; Noted dc summary on chart, and pt very much wanting to dc home; she assures me that if and when her daughter goes out, she will not get up   Follow Up Recommendations  Home health PT;Supervision/Assistance - 24 hour     Equipment Recommendations  Rolling walker with 5" wheels;3in1 (PT) (youth-sized RW)    Recommendations for Other Services       Precautions / Restrictions Precautions Precautions: Fall Restrictions LLE Weight Bearing: Weight bearing as tolerated    Mobility  Bed Mobility Overal bed mobility: Needs Assistance Bed Mobility: Supine to Sit     Supine to sit: Supervision     General bed mobility comments: Cues for safety and technique  Transfers Overall transfer level: Needs assistance Equipment used: Rolling walker (2 wheeled) Transfers: Sit to/from Stand Sit to Stand: Supervision         General transfer comment: Cues for safety and hand placement   Ambulation/Gait Ambulation/Gait assistance: Supervision Ambulation Distance (Feet): 140 Feet Assistive device: Rolling walker (2 wheeled) Gait Pattern/deviations: Step-through pattern;Decreased stride length;Antalgic Gait velocity: decr   General Gait Details: Cues for sequence; no need for assist with RW with a youth-sized RW; Sized rW for optimal fit   Stairs Stairs: Yes Stairs assistance: Min guard Stair Management: One rail Left;Step to pattern;Sideways Number of Stairs: 3 General stair comments: cues for technique and sequence  Wheelchair Mobility    Modified Rankin (Stroke Patients  Only)       Balance             Standing balance-Leahy Scale: Fair                      Cognition Arousal/Alertness: Awake/alert Behavior During Therapy: WFL for tasks assessed/performed Overall Cognitive Status: Within Functional Limits for tasks assessed                      Exercises Total Joint Exercises Quad Sets: AROM;Left;10 reps Towel Squeeze: AROM;Both;10 reps Short Arc Quad: AROM;Left;10 reps Heel Slides: AROM;Left;10 reps Hip ABduction/ADduction: AROM;Left;10 reps Straight Leg Raises: AROM;Left;10 reps    General Comments General comments (skin integrity, edema, etc.): Emphasized the need to take things slowly       Pertinent Vitals/Pain Pain Assessment: 0-10 Pain Score: 2  Pain Location: L hip Pain Descriptors / Indicators: Aching Pain Intervention(s): Monitored during session    Home Living                      Prior Function            PT Goals (current goals can now be found in the care plan section) Acute Rehab PT Goals Patient Stated Goal: go home; would like to go today PT Goal Formulation: With patient Time For Goal Achievement: 07/22/14 Potential to Achieve Goals: Good Progress towards PT goals: Progressing toward goals    Frequency  Min 6X/week    PT Plan Current plan remains appropriate    Co-evaluation             End of  Session   Activity Tolerance: Patient tolerated treatment well Patient left: in chair;with call bell/phone within reach     Time: 2956-2130 PT Time Calculation (min) (ACUTE ONLY): 38 min  Charges:  $Gait Training: 23-37 mins $Therapeutic Exercise: 8-22 mins                    G Codes:      Van Clines [facility] 07/16/2014, 11:46 AM  Van Clines, PT  Acute Rehabilitation Services Pager [phone] Office [phone]

## 2016-09-21 IMAGING — RF DG HIP (WITH PELVIS) OPERATIVE*L*
1 series · 2 of 2 positions shown · non-contrast
Comparison: 07/13/2014

CLINICAL DATA: Three cannulated screws inserted into the left hip.
Fluoroscopy time 24 seconds

EXAM:
OPERATIVE LEFT HIP (WITH PELVIS IF PERFORMED) 2 VIEWS
TECHNIQUE: Fluoroscopic spot image(s) were submitted for interpretation
post-operatively.

[Series 1: run · 2 of 2 slices shown]
[im 1/2]
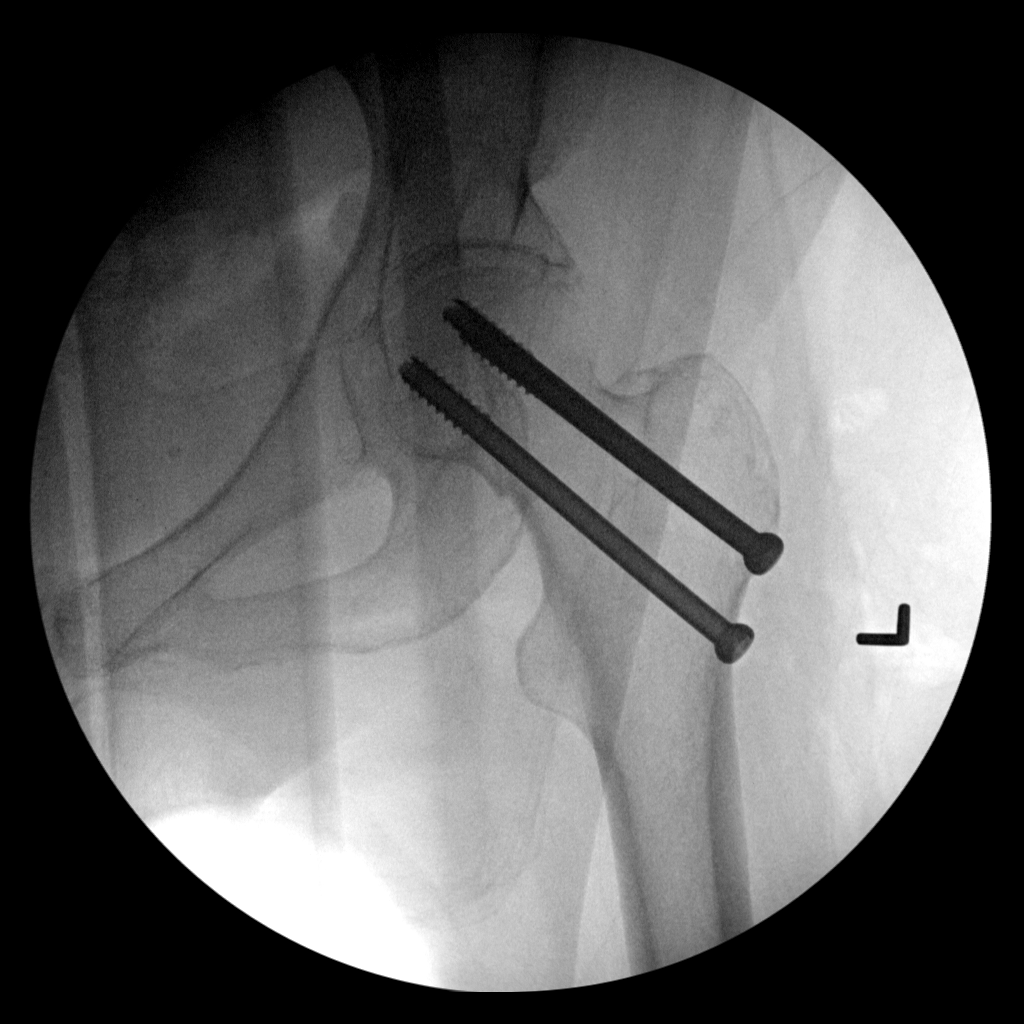
[im 2/2]
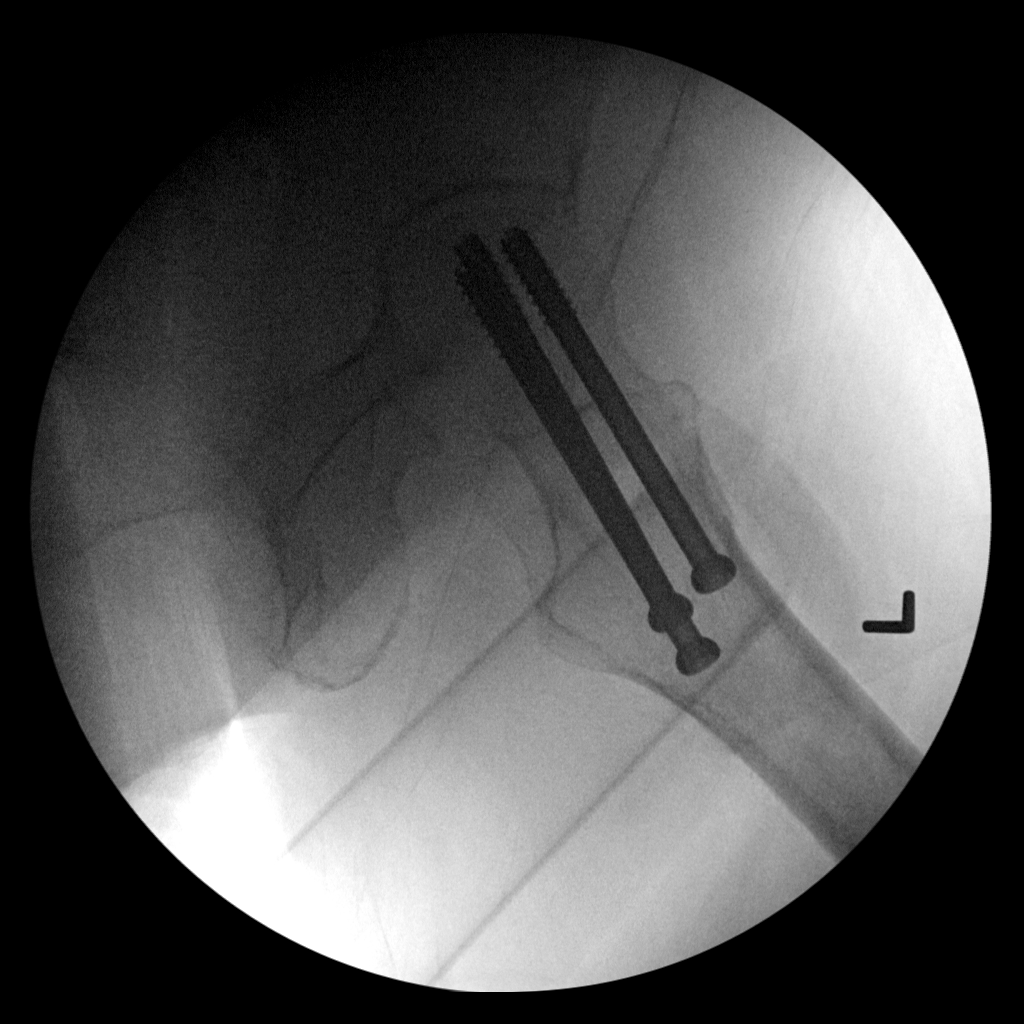

[2 of 2 positions shown; findings below may reference images not displayed]

FINDINGS: Three cannulated screws traverse the femoral neck following ORIF of
the femoral neck. No evidence for dislocation or interval fracture.
IMPRESSION: ORIF left hip.
